# Patient Record
Sex: Female | Born: 1970 | Race: White | Hispanic: No | Smoking: Former smoker
Health system: Southern US, Community
[De-identification: ages and names within clinical notes are randomized; demographics above are authoritative.]

## PROBLEM LIST (undated history)

## (undated) DIAGNOSIS — T8859XA Other complications of anesthesia, initial encounter: Secondary | ICD-10-CM

## (undated) DIAGNOSIS — J45909 Unspecified asthma, uncomplicated: Secondary | ICD-10-CM

## (undated) DIAGNOSIS — T4145XA Adverse effect of unspecified anesthetic, initial encounter: Secondary | ICD-10-CM

## (undated) DIAGNOSIS — Z9889 Other specified postprocedural states: Secondary | ICD-10-CM

## (undated) DIAGNOSIS — R112 Nausea with vomiting, unspecified: Secondary | ICD-10-CM

## (undated) DIAGNOSIS — E041 Nontoxic single thyroid nodule: Secondary | ICD-10-CM

## (undated) DIAGNOSIS — N83209 Unspecified ovarian cyst, unspecified side: Secondary | ICD-10-CM

## (undated) DIAGNOSIS — N939 Abnormal uterine and vaginal bleeding, unspecified: Secondary | ICD-10-CM

## (undated) HISTORY — PX: BREAST ENHANCEMENT SURGERY: SHX7

## (undated) HISTORY — DX: Abnormal uterine and vaginal bleeding, unspecified: N93.9

## (undated) HISTORY — PX: DILATION AND CURETTAGE OF UTERUS: SHX78

## (undated) HISTORY — PX: HYSTEROSCOPY: SHX211

## (undated) HISTORY — DX: Nontoxic single thyroid nodule: E04.1

## (undated) HISTORY — PX: ABLATION: SHX5711

## (undated) HISTORY — PX: SALPINGECTOMY: SHX328

## (undated) HISTORY — DX: Unspecified ovarian cyst, unspecified side: N83.209

---
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
## 1992-06-11 HISTORY — PX: TUBAL LIGATION: SHX77

## 2006-06-11 HISTORY — PX: BUNIONECTOMY: SHX129

## 2008-10-28 ENCOUNTER — Ambulatory Visit: Payer: Self-pay | Admitting: Family Medicine

## 2008-10-28 DIAGNOSIS — M779 Enthesopathy, unspecified: Secondary | ICD-10-CM | POA: Insufficient documentation

## 2009-02-22 DIAGNOSIS — N643 Galactorrhea not associated with childbirth: Secondary | ICD-10-CM | POA: Insufficient documentation

## 2009-03-07 LAB — HM PAP SMEAR: HM Pap smear: NEGATIVE

## 2009-05-11 ENCOUNTER — Ambulatory Visit: Payer: Self-pay | Admitting: Family Medicine

## 2009-05-20 ENCOUNTER — Ambulatory Visit: Payer: Self-pay | Admitting: Urology

## 2010-09-12 ENCOUNTER — Ambulatory Visit: Payer: Self-pay

## 2010-10-15 ENCOUNTER — Ambulatory Visit: Payer: Self-pay | Admitting: Family Medicine

## 2013-09-09 ENCOUNTER — Encounter: Payer: Self-pay | Admitting: Family Medicine

## 2013-10-09 ENCOUNTER — Encounter: Payer: Self-pay | Admitting: Family Medicine

## 2013-11-09 ENCOUNTER — Encounter: Payer: Self-pay | Admitting: Family Medicine

## 2014-06-15 ENCOUNTER — Ambulatory Visit: Payer: Self-pay | Admitting: Obstetrics & Gynecology

## 2014-06-17 ENCOUNTER — Ambulatory Visit: Payer: Self-pay | Admitting: Obstetrics & Gynecology

## 2014-06-17 LAB — CBC
HCT: 37.3 % (ref 35.0–47.0)
HGB: 11.9 g/dL — AB (ref 12.0–16.0)
MCH: 29.4 pg (ref 26.0–34.0)
MCHC: 31.9 g/dL — AB (ref 32.0–36.0)
MCV: 92 fL (ref 80–100)
Platelet: 464 10*3/uL — ABNORMAL HIGH (ref 150–440)
RBC: 4.04 10*6/uL (ref 3.80–5.20)
RDW: 12.5 % (ref 11.5–14.5)
WBC: 13.4 10*3/uL — ABNORMAL HIGH (ref 3.6–11.0)

## 2014-06-17 LAB — PREGNANCY, URINE: PREGNANCY TEST, URINE: NEGATIVE m[IU]/mL

## 2014-06-18 ENCOUNTER — Ambulatory Visit: Payer: Self-pay | Admitting: Obstetrics & Gynecology

## 2014-10-04 LAB — SURGICAL PATHOLOGY

## 2014-10-10 NOTE — Op Note (Signed)
PATIENT NAME:  Kristen Meyer, Kristen Meyer MR#:  [id number] DATE OF BIRTH:  [birth year]  DATE OF PROCEDURE:  06/18/2014  PREOPERATIVE DIAGNOSIS: Pelvic pain and menorrhagia.  POSTOPERATIVE DIAGNOSIS: Pelvic pain and menorrhagia.  PROCEDURES PERFORMED: Hysteroscopy D and C procedure with endometrial resection and operative laparoscopy with partial right salpingectomy and aspiration of left ovarian cyst.   SURGEON: R. Annamarie MajorPaul Harris, MD  ANESTHESIA: General.   ESTIMATED BLOOD LOSS: Minimal.   COMPLICATIONS: None.   FINDINGS: Hysteroscopy reveals an abundant amount of reactive versus adhesive tissue throughout the endometrial cavity. Laparoscopy reveals Meyer right hydrosalpinx, evidence for bilateral tubal ligation using Falope-Rings, and Meyer small normal-appearing left ovarian cyst. The patient has Meyer visually normal right ovary, uterus, appendix, gallbladder, liver, bowel, and other structures. There is no evidence for abscess, infection, bleeding, or other problems within the anterior abdominal cavity.   DISPOSITION: To the recovery room in stable condition.   TECHNIQUE: The patient is prepped and draped in the usual sterile fashion after adequate anesthesia is obtained in the dorsal lithotomy position. Meyer Foley catheter is inserted. Meyer speculum is placed and the cervix is grasped with Meyer tenaculum and the cervix is dilated to size Meyer 18 Pratt dilator. Meyer hysteroscope is inserted with saline distention of the intrauterine cavity and the above-mentioned findings are visualized. Using the minus resection device, an endometrial resection is performed with Meyer significant amount of tissue removed to obtain Meyer clear surface and uniform lining to the intrauterine cavity. There was minimal bleeding noted from the uterus. The tenaculum was removed after the camera was removed with about 500 mL discrepancy of saline fluid during the portion of the case.   Attention is then to the abdomen, where Meyer Veress needle is inserted  through Meyer 5 mm infraumbilical incision after Marcaine is used to anesthetize the skin. Veress needle placement is confirmed using the hanging drop technique and the abdomen is then insufflated with CO2 gas. Meyer 5 mm trocar is then inserted under direct visualization with the laparoscope with no injuries or bleeding noted. The patient is placed in Trendelenburg positioning and the above-mentioned findings are visualized. Meyer 5 mm trocar is placed in the right lower quadrant, lateral to the inferior epigastric blood vessels and an 11 mm trocar is placed in the suprapubic region with no injuries or bleeding noted. Using the 5 mm Harmonic scalpel, the right fallopian tube is carefully dissected free along the mesosalpinx with preservation of the blood supply in the right ovary. The removed partial salpingectomy is then placed in an Endopouch and removed and sent to pathology for further review. The left ovarian cyst is stabilized and is aspirated using the Harmonic scalpel, with simple clear fluid from the small cyst. No other procedures are necessary. There is no free fluid or bleeding in the pelvis. Gas is expelled, trocars are removed, and skin is closed with Dermabond skin glue. The patient goes to the recovery room in stable condition. All sponge, instrument, and needle counts are correct at the conclusion of the case.   ____________________________ R. Annamarie MajorPaul Harris, MD rph:mw D: 06/18/2014 12:50:56 ET T: 06/18/2014 13:33:42 ET JOB#: [id number]  cc: Dierdre Searles. Paul Harris, MD, <Dictator> Nadara MustardOBERT P HARRIS MD ELECTRONICALLY SIGNED 06/22/2014 14:06

## 2015-02-24 DIAGNOSIS — M545 Low back pain, unspecified: Secondary | ICD-10-CM | POA: Insufficient documentation

## 2015-02-24 DIAGNOSIS — K30 Functional dyspepsia: Secondary | ICD-10-CM | POA: Insufficient documentation

## 2015-02-24 DIAGNOSIS — K5900 Constipation, unspecified: Secondary | ICD-10-CM | POA: Insufficient documentation

## 2015-04-21 ENCOUNTER — Encounter: Payer: Self-pay | Admitting: Family Medicine

## 2015-04-21 ENCOUNTER — Ambulatory Visit (INDEPENDENT_AMBULATORY_CARE_PROVIDER_SITE_OTHER): Payer: 59 | Admitting: Family Medicine

## 2015-04-21 VITALS — BP 96/64 | HR 88 | Temp 97.7°F | Resp 16 | Wt 152.0 lb

## 2015-04-21 DIAGNOSIS — A09 Infectious gastroenteritis and colitis, unspecified: Secondary | ICD-10-CM | POA: Diagnosis not present

## 2015-04-21 DIAGNOSIS — R1030 Lower abdominal pain, unspecified: Secondary | ICD-10-CM | POA: Diagnosis not present

## 2015-04-21 DIAGNOSIS — R197 Diarrhea, unspecified: Secondary | ICD-10-CM

## 2015-04-21 LAB — POCT URINALYSIS DIPSTICK
Bilirubin, UA: NEGATIVE
Clarity, UA: NEGATIVE
Glucose, UA: NEGATIVE
KETONES UA: NEGATIVE
Leukocytes, UA: NEGATIVE
Nitrite, UA: NEGATIVE
PH UA: 6.5
Protein, UA: NEGATIVE
Spec Grav, UA: 1.02
Urobilinogen, UA: 0.2

## 2015-04-21 NOTE — Progress Notes (Signed)
Patient ID: Kristen Meyer, female   DOB: 1971/03/01, 44 y.o.   MRN: [id number] Name: Kristen Meyer   MRN: [id number]    DOB: 07/13/1970   Date:04/21/2015       Progress Note  Subjective  Chief Complaint  Chief Complaint  Patient presents with  . Abdominal Pain  . Diarrhea   Abdominal Pain This is a new problem. The current episode started yesterday (had similar abdominal pain 02-14-15 and 03-25-15). The problem occurs 2 to 4 times per day. The problem has been unchanged. Pain location: across lower abdomen. Quality: bloating and abdominal cramps. The abdominal pain does not radiate. Associated symptoms include diarrhea and nausea. Pertinent negatives include no constipation, dysuria, fever, hematuria or vomiting. Associated symptoms comments: Some dark to black stools. She has tried antibiotics for the symptoms. Prior diagnostic workup includes upper endoscopy and GI consult (for gastritis and epigastric discomfort with positive H.pylori in 2012).  Diarrhea  This is a new problem. The current episode started more than 1 month ago (Seen in an Urgent Care clinic 03-25-15 and treated with Cipro with improvement.). The problem occurs 2 to 4 times per day. The problem has been unchanged. Associated symptoms include abdominal pain and bloating. Pertinent negatives include no fever or vomiting. Associated symptoms comments: Initially felt to be due to some seafood she ate [place] Day weekend at the beach but symptoms continue to return.. Risk factors include suspect food intake.   Patient Active Problem List   Diagnosis Date Noted  . CN (constipation) 02/24/2015  . Acid indigestion 02/24/2015  . LBP (low back pain) 02/24/2015  . Galactorrhea not associated with childbirth 02/22/2009  . Enthesopathy 10/28/2008  . Anemia, iron deficiency 03/11/2007   Past Surgical History  Procedure Laterality Date  . Tubal ligation  1994  . Tonsillectomy and adenoidectomy  1988  . Bunionectomy Right  2008   Family History  Problem Relation Age of Onset  . COPD Mother   . Healthy Father   . Healthy Sister   . Healthy Brother    History reviewed. No pertinent past medical history.  Social History  Substance Use Topics  . Smoking status: Former Games developer  . Smokeless tobacco: Not on file     Comment: QUIT IN 1992  . Alcohol Use: No   No current outpatient prescriptions on file.  Allergies  Allergen Reactions  . Amoxicillin Hives  . Clarithromycin   . Latex   . Penicillins   . Sulfa Antibiotics    Review of Systems  Constitutional: Negative.  Negative for fever.  HENT: Negative.   Eyes: Negative.   Respiratory: Negative.   Cardiovascular: Negative.   Gastrointestinal: Positive for nausea, abdominal pain, diarrhea and bloating. Negative for vomiting and constipation.  Genitourinary: Negative.  Negative for dysuria and hematuria.  Musculoskeletal: Negative.   Neurological: Negative.   Endo/Heme/Allergies: Negative.   Psychiatric/Behavioral: Negative.    Objective  Filed Vitals:   04/21/15 1024  BP: 96/64  Pulse: 88  Temp: 97.7 F (36.5 C)  TempSrc: Oral  Resp: 16  Weight: 152 lb (68.947 kg)  SpO2: 98%   Physical Exam  Constitutional: She is oriented to person, place, and time and well-developed, well-nourished, and in no distress.  HENT:  Head: Normocephalic.  Eyes: Conjunctivae are normal.  Neck: Neck supple.  Cardiovascular: Normal rate and regular rhythm.   Pulmonary/Chest: Breath sounds normal.  Abdominal: Soft. Bowel sounds are normal. There is tenderness.  Across lower abdomen with deep palpation. No masses.  Musculoskeletal: Normal range of motion.  Neurological: She is alert and oriented to person, place, and time.    Assessment & Plan  1. Lower abdominal pain Initial incidence was Labor Day weekend after eating some seafood at the beach. Symptoms abated in a few days but returned the next month. Was evaluated at an Urgent Care clinic and  treated with Cipro. Improved but has had recurrence yesterday with some dark to black stools (has not taken any Pepto-Bismol) and lower abdominal cramps (worse on the left). Suspect infectious diarrhea or diverticulitis. Will get routine labs and schedule CT scan. History of bilateral tubal ligation in 1994 and endometrial ablation therapy for DUB December 2015. Had a complication of uterine perforation requiring further surgery January 2016. - CBC with Differential/Platelet - COMPLETE METABOLIC PANEL WITH GFR - CT Abdomen Pelvis W Contrast  2. Diarrhea of presumed infectious origin Denies fever but has some dark to black stools. No epigastric discomfort or dyspepsia. Worried about persistent infection [place] Will get stool culture and should start a bland diet with increased fluid intake. - Stool Culture

## 2015-04-22 ENCOUNTER — Telehealth: Payer: Self-pay | Admitting: Family Medicine

## 2015-04-22 ENCOUNTER — Telehealth: Payer: Self-pay

## 2015-04-22 LAB — COMPREHENSIVE METABOLIC PANEL
A/G RATIO: 1.7 (ref 1.1–2.5)
ALT: 13 IU/L (ref 0–32)
AST: 19 IU/L (ref 0–40)
Albumin: 4.3 g/dL (ref 3.5–5.5)
Alkaline Phosphatase: 69 IU/L (ref 39–117)
BUN/Creatinine Ratio: 12 (ref 9–23)
BUN: 11 mg/dL (ref 6–24)
Bilirubin Total: 0.5 mg/dL (ref 0.0–1.2)
CO2: 27 mmol/L (ref 18–29)
Calcium: 9.3 mg/dL (ref 8.7–10.2)
Chloride: 99 mmol/L (ref 97–106)
Creatinine, Ser: 0.92 mg/dL (ref 0.57–1.00)
GFR, EST AFRICAN AMERICAN: 88 mL/min/{1.73_m2} (ref >59)
GFR, EST NON AFRICAN AMERICAN: 77 mL/min/{1.73_m2} (ref >59)
GLOBULIN, TOTAL: 2.6 g/dL (ref 1.5–4.5)
Glucose: 80 mg/dL (ref 65–99)
POTASSIUM: 4.2 mmol/L (ref 3.5–5.2)
SODIUM: 141 mmol/L (ref 136–144)
TOTAL PROTEIN: 6.9 g/dL (ref 6.0–8.5)

## 2015-04-22 LAB — CBC WITH DIFFERENTIAL/PLATELET
BASOS: 1 %
Basophils Absolute: 0 10*3/uL (ref 0.0–0.2)
EOS (ABSOLUTE): 0.3 10*3/uL (ref 0.0–0.4)
Eos: 5 %
Hematocrit: 39 % (ref 34.0–46.6)
Hemoglobin: 13 g/dL (ref 11.1–15.9)
IMMATURE GRANS (ABS): 0 10*3/uL (ref 0.0–0.1)
Immature Granulocytes: 0 %
Lymphocytes Absolute: 1.3 10*3/uL (ref 0.7–3.1)
Lymphs: 23 %
MCH: 30.3 pg (ref 26.6–33.0)
MCHC: 33.3 g/dL (ref 31.5–35.7)
MCV: 91 fL (ref 79–97)
MONOS ABS: 0.5 10*3/uL (ref 0.1–0.9)
Monocytes: 8 %
NEUTROS ABS: 3.5 10*3/uL (ref 1.4–7.0)
Neutrophils: 63 %
Platelets: 361 10*3/uL (ref 150–379)
RBC: 4.29 x10E6/uL (ref 3.77–5.28)
RDW: 13.1 % (ref 12.3–15.4)
WBC: 5.7 10*3/uL (ref 3.4–10.8)

## 2015-04-22 NOTE — Telephone Encounter (Signed)
Insurance company would like a peer to peer to get CT approved.Phone [phone].Case # [id number]

## 2015-04-22 NOTE — Telephone Encounter (Signed)
Left message to call back or come in to the office if any problems tomorrow. Still waiting on the stool culture report.

## 2015-04-22 NOTE — Telephone Encounter (Signed)
Patient was advised of labs and she wanted to let you know of a new new symptoms and yesterday she developed severe fatigue, weakness, severe tender breasts and nipples, headache, no nausea or vomiting, a little bit of a sore throat. She still feels like this today and states these are the symptoms like she had before (i am assuming it is something you know about as she said). -aa

## 2015-04-26 NOTE — Telephone Encounter (Signed)
Got confirmation for CT scan of abdomen and pelvis with contrast from peer-to-peer discussion (confirmation # [id number] valid till 06-10-15).

## 2015-04-29 LAB — STOOL CULTURE: E coli, Shiga toxin Assay: NEGATIVE

## 2015-05-02 ENCOUNTER — Telehealth: Payer: Self-pay | Admitting: Family Medicine

## 2015-05-02 NOTE — Telephone Encounter (Signed)
Patient advised as directed below. Patient verbalized understanding.  

## 2015-05-02 NOTE — Telephone Encounter (Signed)
Please review

## 2015-05-02 NOTE — Telephone Encounter (Signed)
Advise patient the CT scan was ordered to cover the lower abdomen and pelvis. This will give pictures from front to back and look at all structures in that area.

## 2015-05-02 NOTE — Telephone Encounter (Signed)
Pt called saying she is still hurting in her lower abdomin and back .  She went to a massage therapist.  Pt wants to make sure that the CT that she is having Wednesday will cover the area that she is having pain in.  Her call back is [phone]  Thanks Barth Kirkseri

## 2015-05-02 NOTE — Telephone Encounter (Signed)
-----   Message from Jodell CiproDennis E Waucomahrismon, [place] sent at 04/29/2015  5:58 PM EST ----- Stool culture final report was negative for any infection (even E.coli assay was negative). Finally got insurance to approve CT scan and it was set for 05-04-15. Awaiting final report.

## 2015-05-02 NOTE — Telephone Encounter (Signed)
LMTCB

## 2015-05-04 ENCOUNTER — Ambulatory Visit
Admission: RE | Admit: 2015-05-04 | Discharge: 2015-05-04 | Disposition: A | Discharge status: Home / Self Care | Payer: 59 | Source: Ambulatory Visit | Attending: Family Medicine | Admitting: Family Medicine

## 2015-05-04 DIAGNOSIS — R1030 Lower abdominal pain, unspecified: Secondary | ICD-10-CM | POA: Insufficient documentation

## 2015-05-04 MED ORDER — IOHEXOL 350 MG/ML SOLN
100.0000 mL | Freq: Once | INTRAVENOUS | Status: AC | PRN
  Administered 2015-05-04: 85 mL via INTRAVENOUS

## 2015-05-09 ENCOUNTER — Telehealth: Payer: Self-pay

## 2015-05-09 NOTE — Telephone Encounter (Signed)
Advised patient as below. Patient reports that she will call if pain persists.

## 2015-05-09 NOTE — Telephone Encounter (Signed)
Left message to call back  

## 2015-05-09 NOTE — Telephone Encounter (Signed)
-----   Message from Dennis Tamsen RoersE Chrismon, [place] sent at 05/09/2015  5:53 AM EST ----- No evidence of acute intra-abdominal or pelvic abnormalities on CT scan. Normal appendix and no kidney stones. No sign of bowel obstruction of diverticulitis. Some moderate amount of stool in the colon (minimal constipation). If abdomen no better with use of Pepto-Bismol, should consider referral to gastroenterologist. May need more specific treatment for possible irritable bowel syndrome.

## 2015-05-16 ENCOUNTER — Telehealth: Payer: Self-pay | Admitting: Family Medicine

## 2015-05-16 DIAGNOSIS — R1030 Lower abdominal pain, unspecified: Secondary | ICD-10-CM

## 2015-05-16 NOTE — Telephone Encounter (Signed)
Please advise 

## 2015-05-16 NOTE — Telephone Encounter (Signed)
Pt called saying she was still having problems after the CT and wants to see someone for a colonoscopy..  She said you had told her to call back.  Her call back is [phone]  Thanks  teri

## 2015-05-16 NOTE — Telephone Encounter (Signed)
Schedule referral to gastroenterologist for possible colonoscopy and evaluation of abdominal pain.

## 2015-05-17 NOTE — Telephone Encounter (Signed)
Patient advised as directed below. 

## 2015-06-22 LAB — HM COLONOSCOPY

## 2015-07-06 ENCOUNTER — Encounter: Payer: Self-pay | Admitting: Family Medicine

## 2015-07-27 ENCOUNTER — Encounter: Payer: Self-pay | Admitting: Family Medicine

## 2015-11-23 ENCOUNTER — Encounter: Payer: Self-pay | Admitting: Family Medicine

## 2015-11-23 ENCOUNTER — Ambulatory Visit (INDEPENDENT_AMBULATORY_CARE_PROVIDER_SITE_OTHER): Payer: 59 | Admitting: Family Medicine

## 2015-11-23 VITALS — BP 102/60 | HR 70 | Temp 97.9°F | Resp 16 | Ht 67.0 in | Wt 156.0 lb

## 2015-11-23 DIAGNOSIS — Z87448 Personal history of other diseases of urinary system: Secondary | ICD-10-CM | POA: Diagnosis not present

## 2015-11-23 DIAGNOSIS — J301 Allergic rhinitis due to pollen: Secondary | ICD-10-CM | POA: Diagnosis not present

## 2015-11-23 DIAGNOSIS — J45909 Unspecified asthma, uncomplicated: Secondary | ICD-10-CM | POA: Insufficient documentation

## 2015-11-23 DIAGNOSIS — R42 Dizziness and giddiness: Secondary | ICD-10-CM | POA: Diagnosis not present

## 2015-11-23 DIAGNOSIS — J309 Allergic rhinitis, unspecified: Secondary | ICD-10-CM | POA: Insufficient documentation

## 2015-11-23 LAB — POCT URINALYSIS DIPSTICK
Bilirubin, UA: NEGATIVE
Blood, UA: NEGATIVE
GLUCOSE UA: NEGATIVE
Ketones, UA: NEGATIVE
LEUKOCYTES UA: NEGATIVE
NITRITE UA: NEGATIVE
PROTEIN UA: NEGATIVE
Spec Grav, UA: 1.01
UROBILINOGEN UA: 0.2
pH, UA: 6

## 2015-11-23 MED ORDER — MECLIZINE HCL 25 MG PO TABS: 25.0000 mg | ORAL_TABLET | Freq: Three times a day (TID) | ORAL | Status: DC | PRN

## 2015-11-23 MED ORDER — MOMETASONE FUROATE 50 MCG/ACT NA SUSP: 2.0000 | Freq: Every day | NASAL | Status: DC

## 2015-11-23 MED ORDER — ALBUTEROL SULFATE HFA 108 (90 BASE) MCG/ACT IN AERS
2.0000 | INHALATION_SPRAY | Freq: Four times a day (QID) | RESPIRATORY_TRACT | Status: DC | PRN
Start: 2015-11-23 — End: 2016-07-11

## 2015-11-23 NOTE — Progress Notes (Signed)
Subjective:     Patient ID: Kristen Meyer, female   DOB: [birth year], 45 y.o.   MRN: [id number]  HPI  Chief Complaint  Patient presents with  . Allergies  . Dizziness  States allergies are flaring after visits to the beach. This is manifested by clear PND, wheezing, and vertigo. Reports she needs refills of her albuterol inhaler. Also has noticed decreased urinary output despite intake of fluids and wish U/A checked. Reports hx of prior kidney injury.   Review of Systems     Objective:   Physical Exam  Constitutional: She appears well-developed and well-nourished. No distress.  Ears: T.M's intact without inflammation Sinuses: non-tender Throat: tonsils absent Neck: no cervical adenopathy Lungs: clear     Assessment:    1. Allergic rhinitis due to pollen - mometasone (NASONEX) 50 MCG/ACT nasal spray; Place 2 sprays into the nose daily.  Dispense: 17 g; Refill: 5  2. History of changes in urinary output - POCT urinalysis dipstick  3. Mild reactive airways disease - albuterol (PROVENTIL HFA;VENTOLIN HFA) 108 (90 Base) MCG/ACT inhaler; Inhale 2 puffs into the lungs every 6 (six) hours as needed for wheezing or shortness of breath. May choose inhaler covered by insurance  Dispense: 1 Inhaler; Refill: 5  4. Vertigo - meclizine (ANTIVERT) 25 MG tablet; Take 1 tablet (25 mg total) by mouth 3 (three) times daily as needed for dizziness.  Dispense: 21 tablet; Refill: 0    Plan:    Call if vertigo persists.

## 2015-11-23 NOTE — Patient Instructions (Addendum)
It will take a few days for full effect of the steroid nasal spray. Let us know if vertigo does not improve over the next few days for ENT referral.

## 2016-01-19 ENCOUNTER — Encounter: Payer: Self-pay | Admitting: Family Medicine

## 2016-01-19 ENCOUNTER — Telehealth: Payer: Self-pay | Admitting: Family Medicine

## 2016-01-19 ENCOUNTER — Ambulatory Visit (INDEPENDENT_AMBULATORY_CARE_PROVIDER_SITE_OTHER): Payer: 59 | Admitting: Family Medicine

## 2016-01-19 ENCOUNTER — Other Ambulatory Visit: Payer: Self-pay | Admitting: Family Medicine

## 2016-01-19 VITALS — BP 90/62 | HR 77 | Temp 97.5°F | Resp 16 | Wt 158.4 lb

## 2016-01-19 DIAGNOSIS — R519 Headache, unspecified: Secondary | ICD-10-CM

## 2016-01-19 DIAGNOSIS — W57XXXS Bitten or stung by nonvenomous insect and other nonvenomous arthropods, sequela: Secondary | ICD-10-CM | POA: Diagnosis not present

## 2016-01-19 DIAGNOSIS — R300 Dysuria: Secondary | ICD-10-CM

## 2016-01-19 DIAGNOSIS — R112 Nausea with vomiting, unspecified: Secondary | ICD-10-CM

## 2016-01-19 DIAGNOSIS — R109 Unspecified abdominal pain: Secondary | ICD-10-CM

## 2016-01-19 DIAGNOSIS — N760 Acute vaginitis: Secondary | ICD-10-CM

## 2016-01-19 DIAGNOSIS — R51 Headache: Secondary | ICD-10-CM | POA: Diagnosis not present

## 2016-01-19 DIAGNOSIS — R509 Fever, unspecified: Secondary | ICD-10-CM | POA: Diagnosis not present

## 2016-01-19 DIAGNOSIS — S30861S Insect bite (nonvenomous) of abdominal wall, sequela: Secondary | ICD-10-CM

## 2016-01-19 LAB — POCT URINALYSIS DIPSTICK
Bilirubin, UA: NEGATIVE
Blood, UA: NEGATIVE
Glucose, UA: NEGATIVE
KETONES UA: NEGATIVE
LEUKOCYTES UA: NEGATIVE
NITRITE UA: NEGATIVE
PH UA: 7
PROTEIN UA: NEGATIVE
Spec Grav, UA: 1.01
Urobilinogen, UA: 1

## 2016-01-19 MED ORDER — FLUCONAZOLE 150 MG PO TABS: 150.0000 mg | ORAL_TABLET | Freq: Once | ORAL | 1 refills | Status: AC

## 2016-01-19 MED ORDER — ONDANSETRON HCL 8 MG PO TABS: 8.0000 mg | ORAL_TABLET | Freq: Three times a day (TID) | ORAL | 0 refills | Status: DC | PRN

## 2016-01-19 MED ORDER — DOXYCYCLINE HYCLATE 100 MG PO TABS: 100.0000 mg | ORAL_TABLET | Freq: Two times a day (BID) | ORAL | 0 refills | Status: DC

## 2016-01-19 NOTE — Telephone Encounter (Signed)
For review. KW 

## 2016-01-19 NOTE — Telephone Encounter (Signed)
Patient has been advised. KW 

## 2016-01-19 NOTE — Progress Notes (Signed)
Subjective:     Patient ID: Kristen Meyer, female   DOB: [birth year], 45 y.o.   MRN: [id number]  HPI  Chief Complaint  Patient presents with  . Headache    Patient comes in office today with complaints of headache and fatigue for the past 5 days. Patient states associated with headaches she has had nausea and vomiting. Patient reports following symptoms over the past five days: abdominal cramps, loose stools, bloating, mild dysuria and fever high of 101.3.    States she had an embedded tick several weeks ago on her left flank associated with both an annular rash and a general rash on her left side which has resolved.    Review of Systems     Objective:   Physical Exam  Constitutional: She appears well-developed and well-nourished. No distress.  Abdominal: Soft. There is tenderness (mild in lower quadrants). There is no guarding.  Ears: T.M's intact without inflammation Sinuses: non-tender Throat: tonsils absent Neck: no cervical adenopathy Lungs: clear     Assessment:    1. Fever, unspecified fever cause: cover for tick fever - doxycycline (VIBRA-TABS) 100 MG tablet; Take 1 tablet (100 mg total) by mouth 2 (two) times daily.  Dispense: 20 tablet; Refill: 0 - CBC with Differential/Platelet  2. Headache, unspecified headache type: - doxycycline (VIBRA-TABS) 100 MG tablet; Take 1 tablet (100 mg total) by mouth 2 (two) times daily.  Dispense: 20 tablet; Refill: 0  3. Dysuria - POCT urinalysis dipstick  4. Abdominal cramp - Comprehensive metabolic panel  5. Non-intractable vomiting with nausea, vomiting of unspecified type - ondansetron (ZOFRAN) 8 MG tablet; Take 1 tablet (8 mg total) by mouth every 8 (eight) hours as needed for nausea or vomiting.  Dispense: 21 tablet; Refill: 0  6. Tick bite of flank, sequela - Lyme Disease, IgM, Early Test w/ Rflx - Rickettsial Fever Abs    Plan:    Further f/u pending lab work.

## 2016-01-19 NOTE — Telephone Encounter (Signed)
Diflucan sent in

## 2016-01-19 NOTE — Patient Instructions (Addendum)
Let me know if not improving over the next few days.

## 2016-01-19 NOTE — Telephone Encounter (Signed)
Pt would like Diflucan to be called into [facility]en [facility] b/c she was advised to start taking Doxycycline and that will cause her to get a yeast infection and she would like to go ahead and have Diflucan on hand. Please advise. Thanks TNP

## 2016-01-20 ENCOUNTER — Telehealth: Payer: Self-pay

## 2016-01-20 NOTE — Telephone Encounter (Signed)
Patient has been advised. KW 

## 2016-01-20 NOTE — Telephone Encounter (Signed)
-----   Message from Anola Gurneyobert Chauvin, [place] sent at 01/20/2016  7:54 AM EDT ----- Labs normal pending tick fever antibodies

## 2016-01-23 ENCOUNTER — Telehealth: Payer: Self-pay

## 2016-01-23 LAB — LYME, WESTERN BLOT, SERUM (REFLEXED)
IGG P18 AB.: ABSENT
IGG P23 AB.: ABSENT
IGG P30 AB.: ABSENT
IGG P39 AB.: ABSENT
IGG P93 AB.: ABSENT
IGM P39 AB.: ABSENT
IGM P41 AB.: ABSENT
IgG P28 Ab.: ABSENT
IgG P45 Ab.: ABSENT
IgG P58 Ab.: ABSENT
IgG P66 Ab.: ABSENT
Lyme IgG Wb: NEGATIVE
Lyme IgM Wb: NEGATIVE

## 2016-01-23 LAB — CBC WITH DIFFERENTIAL/PLATELET
BASOS ABS: 0 10*3/uL (ref 0.0–0.2)
BASOS: 1 %
EOS (ABSOLUTE): 0.4 10*3/uL (ref 0.0–0.4)
Eos: 4 %
HEMOGLOBIN: 13.1 g/dL (ref 11.1–15.9)
Hematocrit: 39.2 % (ref 34.0–46.6)
Immature Grans (Abs): 0 10*3/uL (ref 0.0–0.1)
Immature Granulocytes: 0 %
LYMPHS ABS: 1.6 10*3/uL (ref 0.7–3.1)
Lymphs: 20 %
MCH: 30.6 pg (ref 26.6–33.0)
MCHC: 33.4 g/dL (ref 31.5–35.7)
MCV: 92 fL (ref 79–97)
Monocytes Absolute: 0.7 10*3/uL (ref 0.1–0.9)
Monocytes: 8 %
NEUTROS ABS: 5.4 10*3/uL (ref 1.4–7.0)
Neutrophils: 67 %
PLATELETS: 382 10*3/uL — AB (ref 150–379)
RBC: 4.28 x10E6/uL (ref 3.77–5.28)
RDW: 13 % (ref 12.3–15.4)
WBC: 8.1 10*3/uL (ref 3.4–10.8)

## 2016-01-23 LAB — COMPREHENSIVE METABOLIC PANEL
A/G RATIO: 1.5 (ref 1.2–2.2)
ALBUMIN: 4.1 g/dL (ref 3.5–5.5)
ALK PHOS: 66 IU/L (ref 39–117)
ALT: 14 IU/L (ref 0–32)
AST: 17 IU/L (ref 0–40)
BILIRUBIN TOTAL: 0.4 mg/dL (ref 0.0–1.2)
BUN / CREAT RATIO: 9 (ref 9–23)
BUN: 8 mg/dL (ref 6–24)
CHLORIDE: 100 mmol/L (ref 96–106)
CO2: 24 mmol/L (ref 18–29)
Calcium: 9.1 mg/dL (ref 8.7–10.2)
Creatinine, Ser: 0.93 mg/dL (ref 0.57–1.00)
GFR calc Af Amer: 86 mL/min/{1.73_m2} (ref >59)
GFR calc non Af Amer: 75 mL/min/{1.73_m2} (ref >59)
GLUCOSE: 69 mg/dL (ref 65–99)
Globulin, Total: 2.7 g/dL (ref 1.5–4.5)
POTASSIUM: 4.2 mmol/L (ref 3.5–5.2)
SODIUM: 140 mmol/L (ref 134–144)
Total Protein: 6.8 g/dL (ref 6.0–8.5)

## 2016-01-23 LAB — RICKETTSIAL FEVER ABS
Q FEVER PHASE I: NEGATIVE
Q FEVER PHASE II: NEGATIVE
RMSF IgG: NEGATIVE

## 2016-01-23 LAB — LYME, IGM, EARLY TEST/REFLEX: LYME DISEASE AB, QUANT, IGM: 0.95 {index} — AB (ref 0.00–0.79)

## 2016-01-23 NOTE — Telephone Encounter (Signed)
Patient was advised she states she is feeling much better on the doxycycline. KW

## 2016-01-23 NOTE — Telephone Encounter (Signed)
-----   Message from Anola Gurneyobert Chauvin, [place] sent at 01/23/2016 12:05 PM EDT ----- Tick fever antibodies negative but would continue doxycycline if improving.

## 2016-01-23 NOTE — Telephone Encounter (Signed)
[id number] 

## 2016-07-11 ENCOUNTER — Encounter: Payer: Self-pay | Admitting: Obstetrics and Gynecology

## 2016-07-11 ENCOUNTER — Ambulatory Visit (INDEPENDENT_AMBULATORY_CARE_PROVIDER_SITE_OTHER): Payer: Managed Care, Other (non HMO) | Admitting: Obstetrics and Gynecology

## 2016-07-11 VITALS — BP 103/70 | HR 80 | Ht 67.0 in | Wt 159.0 lb

## 2016-07-11 DIAGNOSIS — R102 Pelvic and perineal pain: Secondary | ICD-10-CM | POA: Diagnosis not present

## 2016-07-11 DIAGNOSIS — Z9851 Tubal ligation status: Secondary | ICD-10-CM | POA: Diagnosis not present

## 2016-07-11 DIAGNOSIS — Z9889 Other specified postprocedural states: Secondary | ICD-10-CM

## 2016-07-11 DIAGNOSIS — R319 Hematuria, unspecified: Secondary | ICD-10-CM | POA: Diagnosis not present

## 2016-07-11 LAB — POCT URINALYSIS DIPSTICK
Bilirubin, UA: NEGATIVE
GLUCOSE UA: NEGATIVE
KETONES UA: NEGATIVE
LEUKOCYTES UA: NEGATIVE
Nitrite, UA: NEGATIVE
PH UA: 5
Protein, UA: NEGATIVE
Spec Grav, UA: 1.02
Urobilinogen, UA: NEGATIVE

## 2016-07-11 NOTE — Patient Instructions (Signed)
1. Laparoscopy with peritoneal biopsies will be scheduled 2. Continue taking ibuprofen 800 mg 3 times a day for cramps 3. Return in 1 week prior to surgery for preoperative appointment   Diagnostic Laparoscopy A diagnostic laparoscopy is a procedure to diagnose diseases in the abdomen. During the procedure, a thin, lighted, pencil-sized instrument called a laparoscope is inserted into the abdomen through an incision. The laparoscope allows your health care provider to look at the organs inside your body. Tell a health care provider about:  Any allergies you have.  All medicines you are taking, including vitamins, herbs, eye drops, creams, and over-the-counter medicines.  Any problems you or family members have had with anesthetic medicines.  Any blood disorders you have.  Any surgeries you have had.  Any medical conditions you have. What are the risks? Generally, this is a safe procedure. However, problems can occur, which may include:  Infection.  Bleeding.  Damage to other organs.  Allergic reaction to the anesthetics used during the procedure. What happens before the procedure?  Do not eat or drink anything after midnight on the night before the procedure or as directed by your health care provider.  Ask your health care provider about:  Changing or stopping your regular medicines.  Taking medicines such as aspirin and ibuprofen. These medicines can thin your blood. Do not take these medicines before your procedure if your health care provider instructs you not to.  Plan to have someone take you home after the procedure. What happens during the procedure?  You may be given a medicine to help you relax (sedative).  You will be given a medicine to make you sleep (general anesthetic).  Your abdomen will be inflated with a gas. This will make your organs easier to see.  Small incisions will be made in your abdomen.  A laparoscope and other small instruments will be  inserted into the abdomen through the incisions.  A tissue sample may be removed from an organ in the abdomen for examination.  The instruments will be removed from the abdomen.  The gas will be released.  The incisions will be closed with stitches (sutures). What happens after the procedure? Your blood pressure, heart rate, breathing rate, and blood oxygen level will be monitored often until the medicines you were given have worn off. This information is not intended to replace advice given to you by your health care provider. Make sure you discuss any questions you have with your health care provider. Document Released: 09/03/2000 Document Revised: 10/06/2015 Document Reviewed: 01/08/2014 Elsevier Interactive Patient Education  2017 [employer]

## 2016-07-11 NOTE — Progress Notes (Signed)
GYN ENCOUNTER NOTE  Subjective:   NEW PATIENT EVALUATION        Kristen Meyer is a 46 y.o. 122P2002 female is here for gynecologic evaluation of the following issues:  1. Pelvic pain - cyclic cramping and lower back pain, without bleeding. Interferes with her daily activities. Opted for endometrial ablation in 2015 "just to be done with my periods" which resulted in vaginal bleeding and pelvic pain until hysteroscope was performed in January 2016. Menstrual periods before 2015 were relatively normal with some heavy bleeding. No history of endometriosis.   Previous history notable for laparoscopic bilateral tubal banding with Falope-Rings greater than 20 years ago. Endometrial ablation in 2015 was reportedly associated with complications require surgery including hysteroscopy with resection of endometrium as well as laparoscopy with partial salpingectomy for hydrosalpinx.  Symptoms now include primarily cyclic cramping. No significant bladder symptoms. She does have constipation symptoms as well as diarrhea symptoms, cyclic, noted pre-and post pelvic cramping  2. Status post endometrial ablation 3. Status post tubal ligation   Gynecologic History No LMP recorded. Patient has had an ablation. Contraception: This patient has had an endometrial ablation.  Last Pap: 2-3 years ago. Doesn't recall abnormal results. Last mammogram: 3 years ago  Obstetric History OB History  Gravida Para Term Preterm AB Living  2 2 2     2   SAB TAB Ectopic Multiple Live Births          2    # Outcome Date GA Lbr Len/2nd Weight Sex Delivery Anes PTL Lv  2 Term 1993   8 lb 12.8 oz (3.992 kg) M Vag-Spont   LIV  1 Term 1991   7 lb 2.1 oz (3.234 kg) F Vag-Spont   LIV      Past Medical History:  Diagnosis Date  . Abnormal uterine bleeding   . Ovarian cyst    left    Past Surgical History:  Procedure Laterality Date  . ABLATION     uterine  . BUNIONECTOMY Right 2008  . DILATION AND CURETTAGE OF  UTERUS    . HYSTEROSCOPY    . SALPINGECTOMY Right    partial  . TONSILLECTOMY AND ADENOIDECTOMY  1988  . TUBAL LIGATION  1994    No current outpatient prescriptions on file prior to visit.   No current facility-administered medications on file prior to visit.     Allergies  Allergen Reactions  . Amoxicillin Hives  . Clarithromycin   . Latex   . Penicillins   . Sulfa Antibiotics     Social History   Social History  . Marital status: Married    Spouse name: N/A  . Number of children: N/A  . Years of education: N/A   Occupational History  . Not on file.   Social History Main Topics  . Smoking status: Former Games developermoker  . Smokeless tobacco: Never Used     Comment: QUIT IN [id number]  . Alcohol use No  . Drug use: No  . Sexual activity: Yes    Birth control/ protection: Surgical   Other Topics Concern  . Not on file   Social History Narrative  . No narrative on file    Family History  Problem Relation Age of Onset  . COPD Mother   . Healthy Father   . Healthy Sister   . Healthy Brother   . Breast cancer Neg Hx   . Ovarian cancer Neg Hx   . Colon cancer Neg Hx   . Diabetes  Neg Hx   . Heart disease Neg Hx     The following portions of the patient's history were reviewed and updated as appropriate: allergies, current medications, past family history, past medical history, past social history, past surgical history and problem list.  Review of Systems Review of Systems - General ROS: negative for - chills, fatigue, fever, hot flashes, malaise or night sweats Gastrointestinal ROS: Some cyclic abdominal pain, some cyclic constipation and diarrhea  Musculoskeletal ROS: negative for - joint pain, muscle pain or muscular weakness Genito-Urinary ROS: pelvic pain. No dyspareunia.  Objective:   BP 103/70   Pulse 80   Ht 5\' 7"  (1.702 m)   Wt 159 lb (72.1 kg)   BMI 24.90 kg/m  CONSTITUTIONAL: Well-developed, well-nourished female in no acute distress.  HENT:   Normocephalic, atraumatic.  NECK: Normal range of motion, supple, no masses.  Normal thyroid.  SKIN: Skin is warm and dry. No rash noted. Not diaphoretic. No erythema. No pallor. NEUROLGIC: Alert and oriented to person, place, and time. PSYCHIATRIC: Normal mood and affect. Normal behavior. Normal judgment and thought content. CARDIOVASCULAR:Not Examined RESPIRATORY: Not Examined BREASTS: Not Examined ABDOMEN: Soft, non distended; Non tender.  No Organomegaly. PELVIC:  Bony pelvis is gynecoid  External Genitalia: Normal  BUS: Normal  Vagina: Normal  Cervix: Parous. Nabothian cyst at 9 o'clock.   Uterus: Normal size, shape,consistency, mobile. Some midline tenderness,2/4  Adnexa: Normal; nonpalpable nontender  RV: Normal external exam  Bladder: Nontender MUSCULOSKELETAL: Normal range of motion. No tenderness.  No cyanosis, clubbing, or edema.     Assessment:   1. Pelvic pain, Cyclic, maybe associated with adenomyosis - Urine culture - POCT urinalysis dipstick  2. Hematuria, unspecified type - Urine culture  3. Status post endometrial ablation, possible complications associated from the patient's history  4. Status post tubal ligation (laparoscopic BTB)     Plan:   1. Urine culture 2. POCT urinalysis 3. Laparoscopic biopsy for endometriosis scheduled  4. Patient was counseled regarding the options of management and the workup of this pelvic pain syndrome. Immediate evaluation with laparoscopy and peritoneal biopsies and possibly rule out endometriosis. Alternative management could include treatment with Depo-Lupron to see if this has a clinical impact on her pain. This option is an empiric medical treatment option which could take 4-6 months   Kristen Spahn, PA-S Herold Harms, MD   I have seen, interviewed, and examined the patient in conjunction with the [facility].A. student and affirm the diagnosis and management plan. Kristen Roes A. Taliyah Watrous, MD, FACOG    Note: This dictation was prepared with Dragon dictation along with smaller phrase technology. Any transcriptional errors that result from this process are unintentional.

## 2016-07-12 DIAGNOSIS — Z9889 Other specified postprocedural states: Secondary | ICD-10-CM | POA: Insufficient documentation

## 2016-07-12 DIAGNOSIS — R102 Pelvic and perineal pain: Secondary | ICD-10-CM | POA: Insufficient documentation

## 2016-07-12 DIAGNOSIS — R319 Hematuria, unspecified: Secondary | ICD-10-CM | POA: Insufficient documentation

## 2016-07-12 DIAGNOSIS — Z9851 Tubal ligation status: Secondary | ICD-10-CM | POA: Insufficient documentation

## 2016-07-13 LAB — URINE CULTURE: Organism ID, Bacteria: NO GROWTH

## 2016-07-26 ENCOUNTER — Telehealth: Payer: Self-pay

## 2016-07-26 NOTE — Telephone Encounter (Signed)
Pt states that the rt side of her abd  Above her belly button towards her back is hurting. Taking ibup 800. Used a heating pad. Neither are helping. NO uti sx. Neg urine 1/31. Pos bm. Slight nausea. Pos po hydration. S/p ablation. Advised pt that it did not sound like gyn issue. Advised her to contact her pcp. Pt voices understanding.

## 2016-08-01 ENCOUNTER — Encounter: Payer: Self-pay | Admitting: Obstetrics and Gynecology

## 2016-08-01 ENCOUNTER — Ambulatory Visit (INDEPENDENT_AMBULATORY_CARE_PROVIDER_SITE_OTHER): Payer: Managed Care, Other (non HMO) | Admitting: Obstetrics and Gynecology

## 2016-08-01 VITALS — BP 105/71 | HR 73 | Ht 67.0 in | Wt 161.0 lb

## 2016-08-01 DIAGNOSIS — Z9889 Other specified postprocedural states: Secondary | ICD-10-CM

## 2016-08-01 DIAGNOSIS — Z9851 Tubal ligation status: Secondary | ICD-10-CM

## 2016-08-01 DIAGNOSIS — R102 Pelvic and perineal pain: Secondary | ICD-10-CM

## 2016-08-01 DIAGNOSIS — Z01818 Encounter for other preprocedural examination: Secondary | ICD-10-CM

## 2016-08-01 NOTE — Patient Instructions (Signed)
1. Return in 1 week after surgery for postop check 

## 2016-08-01 NOTE — Progress Notes (Signed)
Subjective:  PREOPERATIVE HISTORY AND PHYSICAL  Date of surgery: 08/06/2016 Procedure: Laparoscopy with peritoneal biopsies Diagnoses: 1. Chronic pelvic pain 2. Status post endometrial ablation 3. 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 G2P2024female scheduled for Laparoscopy with peritoneal biopsies on 08/06/2016 to evaluate chronic pelvic pain and rule out endometriosis. Patient history includes the following details: 1. Pelvic pain - cyclic cramping and lower back pain, without bleeding. Interferes with her daily activities. Opted for endometrial ablation in 2015 "just to be done with my periods" which resulted in vaginal bleeding and pelvic pain until hysteroscope was performed in January 2016. Menstrual periods before 2015 were relatively normal with some heavy bleeding. No history of endometriosis.   Previous history notable for laparoscopic bilateral tubal banding with Falope-Rings greater than 20 years ago. Endometrial ablation in 2015 was reportedly associated with complications require surgery including hysteroscopy with resection of endometrium as well as laparoscopy with partial salpingectomy for hydrosalpinx.  Symptoms now include primarily cyclic cramping. No significant bladder symptoms. She does have constipation symptoms as well as diarrhea symptoms, cyclic, noted pre-and post pelvic cramping  2. Status post endometrial ablation 3. Status post tubal ligation   Gynecologic History No LMP recorded. Patient has had an ablation. Contraception: This patient has had an endometrial ablation.  Last Pap: 2-3 years ago. Doesn't recall abnormal results. Last mammogram: 3 years ago  OB History    Gravida Para Term Preterm AB Living   2 2 2     2    SAB TAB Ectopic Multiple Live Births           2     No LMP recorded. Patient has had an ablation.    Past Medical History:  Diagnosis Date  . Abnormal uterine bleeding   .  Ovarian cyst    left    Past Surgical History:  Procedure Laterality Date  . ABLATION     uterine  . BUNIONECTOMY Right 2008  . DILATION AND CURETTAGE OF UTERUS    . HYSTEROSCOPY    . SALPINGECTOMY Right    partial  . TONSILLECTOMY AND ADENOIDECTOMY  1988  . TUBAL LIGATION  1994    OB History  Gravida Para Term Preterm AB Living  2 2 2     2   SAB TAB Ectopic Multiple Live Births          2    # Outcome Date GA Lbr Len/2nd Weight Sex Delivery Anes PTL Lv  2 Term 1993   8 lb 12.8 oz (3.992 kg) M Vag-Spont   LIV  1 Term 1991   7 lb 2.1 oz (3.234 kg) F Vag-Spont   LIV      Social History   Social History  . Marital status: Married    Spouse name: N/A  . Number of children: N/A  . Years of education: N/A   Social History Main Topics  . Smoking status: Former Games developer  . Smokeless tobacco: Never Used     Comment: QUIT IN 37  . Alcohol use No  . Drug use: No  . Sexual activity: Yes    Birth control/ protection: Surgical   Other Topics Concern  . None   Social History Narrative  . None    Family History  Problem Relation Age of Onset  . COPD Mother   . Healthy Father   . Healthy Sister   . Healthy Brother   . Breast cancer Neg Hx   .  Ovarian cancer Neg Hx   . Colon cancer Neg Hx   . Diabetes Neg Hx   . Heart disease Neg Hx      (Not in a hospital admission)  Allergies  Allergen Reactions  . Amoxicillin Hives    Has patient had a PCN reaction causing immediate rash, facial/tongue/throat swelling, SOB or lightheadedness with hypotension: No Has patient had a PCN reaction causing severe rash involving mucus membranes or skin necrosis: Yes Has patient had a PCN reaction that required hospitalization No Has patient had a PCN reaction occurring within the last 10 years: No If all of the above answers are "NO", then may proceed with Cephalosporin use.   Marland Kitchen. Penicillins   . Clarithromycin Nausea And Vomiting and Rash  . Latex Itching and Rash    burns   . Sulfa Antibiotics Itching, Nausea And Vomiting and Rash    Review of Systems Constitutional: No recent fever/chills/sweats Respiratory: No recent cough/bronchitis Cardiovascular: No chest pain Gastrointestinal: No recent nausea/vomiting/diarrhea Genitourinary: No UTI symptoms; patient reports recent right lower quadrant achiness and stabbing pain extending towards the back Hematologic/lymphatic:No history of coagulopathy or recent blood thinner use    Objective:    BP 105/71   Pulse 73   Ht 5\' 7"  (1.702 m)   Wt 161 lb (73 kg)   BMI 25.22 kg/m   General:   Normal  Skin:   normal  HEENT:  Normal  Neck:  Supple without Adenopathy or Thyromegaly  Lungs:   Heart:              Breasts:   Abdomen:  Pelvis:  M/S   Extremeties:  Neuro:    clear to auscultation bilaterally   Normal without murmur   Not Examined   soft, non-tender; bowel sounds normal; no masses,  no organomegaly   Exam deferred to OR  No CVAT  Warm/Dry   Normal         01/31/2018PELVIC:             Bony pelvis is gynecoid             External Genitalia: Normal             BUS: Normal             Vagina: Normal             Cervix: Parous. Nabothian cyst at 9 o'clock.              Uterus: Normal size, shape,consistency, mobile. Some midline tenderness,2/4             Adnexa: Normal; nonpalpable nontender             RV: Normal external exam             Bladder: Nontender  Assessment:    1. Chronic pelvic pain, cyclic, status post endometrial ablation, status post tubal ligation 2. Cannot rule out endometriosis   Plan:  Laparoscopy with peritoneal biopsies   Preop counseling: The patient is to undergo laparoscopy with peritoneal biopsies to evaluate of the pain syndrome. She is understanding of the planned procedure and is aware of and is accepting of all surgical risks which include but are not limited to bleeding, infection, pelvic organ injury with need for repair, blood clot disorders,  anesthesia risks, etc. All questions have been answered. Informed consent is given. Patient is ready and willing to proceed with surgery as scheduled.  Herold HarmsMartin A Juliyah Mergen, MD  Note: This  dictation was prepared with Dragon dictation along with smaller phrase technology. Any transcriptional errors that result from this process are unintentional.

## 2016-08-01 NOTE — H&P (Signed)
Subjective:  PREOPERATIVE HISTORY AND PHYSICAL  Date of surgery: 08/06/2016 Procedure: Laparoscopy with peritoneal biopsies Diagnoses: 1. Chronic pelvic pain 2. Status post endometrial ablation 3. 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 G2P2024female scheduled for Laparoscopy with peritoneal biopsies on 08/06/2016 to evaluate chronic pelvic pain and rule out endometriosis. Patient history includes the following details: 1. Pelvic pain - cyclic cramping and lower back pain, without bleeding. Interferes with her daily activities. Opted for endometrial ablation in 2015 "just to be done with my periods" which resulted in vaginal bleeding and pelvic pain until hysteroscope was performed in January 2016. Menstrual periods before 2015 were relatively normal with some heavy bleeding. No history of endometriosis.   Previous history notable for laparoscopic bilateral tubal banding with Falope-Rings greater than 20 years ago. Endometrial ablation in 2015 was reportedly associated with complications require surgery including hysteroscopy with resection of endometrium as well as laparoscopy with partial salpingectomy for hydrosalpinx.  Symptoms now include primarily cyclic cramping. No significant bladder symptoms. She does have constipation symptoms as well as diarrhea symptoms, cyclic, noted pre-and post pelvic cramping  2. Status post endometrial ablation 3. Status post tubal ligation   Gynecologic History No LMP recorded. Patient has had an ablation. Contraception: This patient has had an endometrial ablation.  Last Pap: 2-3 years ago. Doesn't recall abnormal results. Last mammogram: 3 years ago  OB History    Gravida Para Term Preterm AB Living   2 2 2     2    SAB TAB Ectopic Multiple Live Births           2     No LMP recorded. Patient has had an ablation.    Past Medical History:  Diagnosis Date  . Abnormal uterine bleeding   .  Ovarian cyst    left    Past Surgical History:  Procedure Laterality Date  . ABLATION     uterine  . BUNIONECTOMY Right 2008  . DILATION AND CURETTAGE OF UTERUS    . HYSTEROSCOPY    . SALPINGECTOMY Right    partial  . TONSILLECTOMY AND ADENOIDECTOMY  1988  . TUBAL LIGATION  1994    OB History  Gravida Para Term Preterm AB Living  2 2 2     2   SAB TAB Ectopic Multiple Live Births          2    # Outcome Date GA Lbr Len/2nd Weight Sex Delivery Anes PTL Lv  2 Term 1993   8 lb 12.8 oz (3.992 kg) M Vag-Spont   LIV  1 Term 1991   7 lb 2.1 oz (3.234 kg) F Vag-Spont   LIV      Social History   Social History  . Marital status: Married    Spouse name: N/A  . Number of children: N/A  . Years of education: N/A   Social History Main Topics  . Smoking status: Former Games developer  . Smokeless tobacco: Never Used     Comment: QUIT IN 37  . Alcohol use No  . Drug use: No  . Sexual activity: Yes    Birth control/ protection: Surgical   Other Topics Concern  . None   Social History Narrative  . None    Family History  Problem Relation Age of Onset  . COPD Mother   . Healthy Father   . Healthy Sister   . Healthy Brother   . Breast cancer Neg Hx   .  Ovarian cancer Neg Hx   . Colon cancer Neg Hx   . Diabetes Neg Hx   . Heart disease Neg Hx      (Not in a hospital admission)  Allergies  Allergen Reactions  . Amoxicillin Hives    Has patient had a PCN reaction causing immediate rash, facial/tongue/throat swelling, SOB or lightheadedness with hypotension: No Has patient had a PCN reaction causing severe rash involving mucus membranes or skin necrosis: Yes Has patient had a PCN reaction that required hospitalization No Has patient had a PCN reaction occurring within the last 10 years: No If all of the above answers are "NO", then may proceed with Cephalosporin use.   Marland Kitchen. Penicillins   . Clarithromycin Nausea And Vomiting and Rash  . Latex Itching and Rash    burns   . Sulfa Antibiotics Itching, Nausea And Vomiting and Rash    Review of Systems Constitutional: No recent fever/chills/sweats Respiratory: No recent cough/bronchitis Cardiovascular: No chest pain Gastrointestinal: No recent nausea/vomiting/diarrhea Genitourinary: No UTI symptoms; patient reports recent right lower quadrant achiness and stabbing pain extending towards the back Hematologic/lymphatic:No history of coagulopathy or recent blood thinner use    Objective:    BP 105/71   Pulse 73   Ht 5\' 7"  (1.702 m)   Wt 161 lb (73 kg)   BMI 25.22 kg/m   General:   Normal  Skin:   normal  HEENT:  Normal  Neck:  Supple without Adenopathy or Thyromegaly  Lungs:   Heart:              Breasts:   Abdomen:  Pelvis:  M/S   Extremeties:  Neuro:    clear to auscultation bilaterally   Normal without murmur   Not Examined   soft, non-tender; bowel sounds normal; no masses,  no organomegaly   Exam deferred to OR  No CVAT  Warm/Dry   Normal         01/31/2018PELVIC:             Bony pelvis is gynecoid             External Genitalia: Normal             BUS: Normal             Vagina: Normal             Cervix: Parous. Nabothian cyst at 9 o'clock.              Uterus: Normal size, shape,consistency, mobile. Some midline tenderness,2/4             Adnexa: Normal; nonpalpable nontender             RV: Normal external exam             Bladder: Nontender  Assessment:    1. Chronic pelvic pain, cyclic, status post endometrial ablation, status post tubal ligation 2. Cannot rule out endometriosis   Plan:  Laparoscopy with peritoneal biopsies   Preop counseling: The patient is to undergo laparoscopy with peritoneal biopsies to evaluate of the pain syndrome. She is understanding of the planned procedure and is aware of and is accepting of all surgical risks which include but are not limited to bleeding, infection, pelvic organ injury with need for repair, blood clot disorders,  anesthesia risks, etc. All questions have been answered. Informed consent is given. Patient is ready and willing to proceed with surgery as scheduled.  Herold HarmsMartin A Leylany Nored, MD  Note: This  dictation was prepared with Dragon dictation along with smaller phrase technology. Any transcriptional errors that result from this process are unintentional.

## 2016-08-02 ENCOUNTER — Encounter: Payer: Managed Care, Other (non HMO) | Admitting: Obstetrics and Gynecology

## 2016-08-02 ENCOUNTER — Encounter
Admission: RE | Admit: 2016-08-02 | Discharge: 2016-08-02 | Disposition: A | Discharge status: Home / Self Care | Payer: Managed Care, Other (non HMO) | Source: Ambulatory Visit | Attending: Obstetrics and Gynecology | Admitting: Obstetrics and Gynecology

## 2016-08-02 DIAGNOSIS — N939 Abnormal uterine and vaginal bleeding, unspecified: Secondary | ICD-10-CM | POA: Insufficient documentation

## 2016-08-02 DIAGNOSIS — D509 Iron deficiency anemia, unspecified: Secondary | ICD-10-CM | POA: Diagnosis not present

## 2016-08-02 DIAGNOSIS — N643 Galactorrhea not associated with childbirth: Secondary | ICD-10-CM | POA: Diagnosis not present

## 2016-08-02 DIAGNOSIS — J45909 Unspecified asthma, uncomplicated: Secondary | ICD-10-CM | POA: Insufficient documentation

## 2016-08-02 DIAGNOSIS — R319 Hematuria, unspecified: Secondary | ICD-10-CM | POA: Diagnosis not present

## 2016-08-02 DIAGNOSIS — K5900 Constipation, unspecified: Secondary | ICD-10-CM | POA: Insufficient documentation

## 2016-08-02 DIAGNOSIS — R102 Pelvic and perineal pain: Secondary | ICD-10-CM | POA: Insufficient documentation

## 2016-08-02 DIAGNOSIS — Z01812 Encounter for preprocedural laboratory examination: Secondary | ICD-10-CM | POA: Insufficient documentation

## 2016-08-02 HISTORY — DX: Nausea with vomiting, unspecified: Z98.890

## 2016-08-02 HISTORY — DX: Other specified postprocedural states: R11.2

## 2016-08-02 HISTORY — DX: Other complications of anesthesia, initial encounter: T88.59XA

## 2016-08-02 HISTORY — DX: Adverse effect of unspecified anesthetic, initial encounter: T41.45XA

## 2016-08-02 LAB — RAPID HIV SCREEN (HIV 1/2 AB+AG)
HIV 1/2 ANTIBODIES: NONREACTIVE
HIV-1 P24 Antigen - HIV24: NONREACTIVE

## 2016-08-02 LAB — CBC WITH DIFFERENTIAL/PLATELET
BASOS PCT: 1 %
Basophils Absolute: 0.1 10*3/uL (ref 0–0.1)
Eosinophils Absolute: 0.3 10*3/uL (ref 0–0.7)
Eosinophils Relative: 4 %
HEMATOCRIT: 36.5 % (ref 35.0–47.0)
HEMOGLOBIN: 12.7 g/dL (ref 12.0–16.0)
LYMPHS ABS: 1.5 10*3/uL (ref 1.0–3.6)
LYMPHS PCT: 22 %
MCH: 32 pg (ref 26.0–34.0)
MCHC: 34.8 g/dL (ref 32.0–36.0)
MCV: 91.9 fL (ref 80.0–100.0)
MONO ABS: 0.6 10*3/uL (ref 0.2–0.9)
MONOS PCT: 9 %
NEUTROS ABS: 4.5 10*3/uL (ref 1.4–6.5)
NEUTROS PCT: 64 %
Platelets: 367 10*3/uL (ref 150–440)
RBC: 3.97 MIL/uL (ref 3.80–5.20)
RDW: 13 % (ref 11.5–14.5)
WBC: 7 10*3/uL (ref 3.6–11.0)

## 2016-08-02 LAB — TYPE AND SCREEN
ABO/RH(D): A POS
ANTIBODY SCREEN: NEGATIVE

## 2016-08-02 NOTE — Pre-Procedure Instructions (Signed)
PATIENT REQUESTS ZOFRAN FOR N/V. STATES PHENERGAN AND TRANSDERM SCOP PATCH MAKE WORSE

## 2016-08-02 NOTE — Patient Instructions (Signed)
  Your procedure is scheduled on: 08/06/16 Report to Day Surgery. [facility]OND FLOOR To find out your arrival time please call [phone] between 1PM - 3PM on 08/03/16  Remember: Instructions that are not followed completely may result in serious medical risk, up to and including death, or upon the discretion of your surgeon and anesthesiologist your surgery may need to be rescheduled.    _X___ 1. Do not eat food or drink liquids after midnight. No gum chewing or hard candies.     ____ 2. No Alcohol for 24 hours before or after surgery.   ____ 3. Do Not Smoke For 24 Hours Prior to Your Surgery.   ____ 4. Bring all medications with you on the day of surgery if instructed.    __X__ 5. Notify your doctor if there is any change in your medical condition     (cold, fever, infections).       Do not wear jewelry, make-up, hairpins, clips or nail polish.  Do not wear lotions, powders, or perfumes. You may wear deodorant.  Do not shave 48 hours prior to surgery. Men may shave face and neck.  Do not bring valuables to the hospital.    [facility] is not [facility] for any belongings or valuables.               Contacts, dentures or bridgework may not be worn into surgery.  Leave your suitcase in the car. After surgery it may be brought to your room.  For patients admitted to the hospital, discharge time is determined by your                treatment team.   Patients discharged the day of surgery will not be allowed to drive home.   Please read over the following fact sheets that you were given:   Surgical Site Infection Prevention   ____ Take these medicines the morning of surgery with A SIP OF WATER:    1. NONE  2.   3.   4.  5.  6.  ____ Fleet Enema (as directed)   __X__ Use CHG Soap as directed  ____ Use inhalers on the day of surgery  ____ Stop metformin 2 days prior to surgery    ____ Take 1/2 of usual insulin dose the night before surgery and none on the  morning of surgery.   ____ Stop Coumadin/Plavix/aspirin on  __X__ Stop Anti-inflammatories on     ALREADY STOPPED   ____ Stop supplements until after surgery.    ____ Bring C-Pap to the hospital.

## 2016-08-03 LAB — RPR: RPR: NONREACTIVE

## 2016-08-06 ENCOUNTER — Encounter
Admission: RE | Disposition: A | Discharge status: Home / Self Care | Payer: Self-pay | Source: Ambulatory Visit | Attending: Obstetrics and Gynecology

## 2016-08-06 ENCOUNTER — Ambulatory Visit: Payer: Managed Care, Other (non HMO) | Admitting: Anesthesiology

## 2016-08-06 ENCOUNTER — Ambulatory Visit
Admission: RE | Admit: 2016-08-06 | Discharge: 2016-08-06 | Disposition: A | Discharge status: Home / Self Care | Payer: Managed Care, Other (non HMO) | Source: Ambulatory Visit | Attending: Obstetrics and Gynecology | Admitting: Obstetrics and Gynecology

## 2016-08-06 DIAGNOSIS — Z9851 Tubal ligation status: Secondary | ICD-10-CM | POA: Diagnosis not present

## 2016-08-06 DIAGNOSIS — Z87891 Personal history of nicotine dependence: Secondary | ICD-10-CM | POA: Insufficient documentation

## 2016-08-06 DIAGNOSIS — R102 Pelvic and perineal pain: Secondary | ICD-10-CM | POA: Diagnosis not present

## 2016-08-06 DIAGNOSIS — N838 Other noninflammatory disorders of ovary, fallopian tube and broad ligament: Secondary | ICD-10-CM | POA: Diagnosis not present

## 2016-08-06 DIAGNOSIS — G8929 Other chronic pain: Secondary | ICD-10-CM | POA: Diagnosis not present

## 2016-08-06 DIAGNOSIS — Z9889 Other specified postprocedural states: Secondary | ICD-10-CM

## 2016-08-06 HISTORY — PX: LAPAROSCOPY: SHX197

## 2016-08-06 LAB — ABO/RH: ABO/RH(D): A POS

## 2016-08-06 LAB — POCT PREGNANCY, URINE: Preg Test, Ur: NEGATIVE

## 2016-08-06 SURGERY — LAPAROSCOPY, DIAGNOSTIC
Anesthesia: General

## 2016-08-06 MED ORDER — ONDANSETRON HCL 4 MG/2ML IJ SOLN
INTRAMUSCULAR | Status: AC
  Filled 2016-08-06: qty 2

## 2016-08-06 MED ORDER — PROPOFOL 10 MG/ML IV BOLUS
INTRAVENOUS | Status: AC
  Filled 2016-08-06: qty 20

## 2016-08-06 MED ORDER — OXYCODONE-ACETAMINOPHEN 5-325 MG PO TABS
1.0000 | ORAL_TABLET | ORAL | Status: DC | PRN
Start: 2016-08-06 — End: 2016-08-06
  Administered 2016-08-06: 1 via ORAL

## 2016-08-06 MED ORDER — ONDANSETRON HCL 4 MG/2ML IJ SOLN
INTRAMUSCULAR | Status: DC | PRN
  Administered 2016-08-06: 4 mg via INTRAVENOUS

## 2016-08-06 MED ORDER — FAMOTIDINE 20 MG PO TABS
20.0000 mg | ORAL_TABLET | Freq: Once | ORAL | Status: AC
  Administered 2016-08-06: 20 mg via ORAL

## 2016-08-06 MED ORDER — ONDANSETRON HCL 4 MG/2ML IJ SOLN: 4.0000 mg | Freq: Once | INTRAMUSCULAR | Status: DC | PRN

## 2016-08-06 MED ORDER — ROCURONIUM BROMIDE 50 MG/5ML IV SOLN
INTRAVENOUS | Status: AC
  Filled 2016-08-06: qty 1

## 2016-08-06 MED ORDER — LACTATED RINGERS IV SOLN
INTRAVENOUS | Status: DC
  Administered 2016-08-06: 12:00:00 via INTRAVENOUS

## 2016-08-06 MED ORDER — FENTANYL CITRATE (PF) 100 MCG/2ML IJ SOLN
INTRAMUSCULAR | Status: DC | PRN
  Administered 2016-08-06 (×2): 50 ug via INTRAVENOUS

## 2016-08-06 MED ORDER — ROCURONIUM BROMIDE 100 MG/10ML IV SOLN
INTRAVENOUS | Status: DC | PRN
  Administered 2016-08-06: 40 mg via INTRAVENOUS

## 2016-08-06 MED ORDER — OXYCODONE-ACETAMINOPHEN 5-325 MG PO TABS
ORAL_TABLET | ORAL | Status: AC
  Filled 2016-08-06: qty 1

## 2016-08-06 MED ORDER — DEXAMETHASONE SODIUM PHOSPHATE 10 MG/ML IJ SOLN
INTRAMUSCULAR | Status: DC | PRN
  Administered 2016-08-06: 10 mg via INTRAVENOUS

## 2016-08-06 MED ORDER — KETOROLAC TROMETHAMINE 30 MG/ML IJ SOLN
INTRAMUSCULAR | Status: DC | PRN
  Administered 2016-08-06: 30 mg via INTRAVENOUS

## 2016-08-06 MED ORDER — FENTANYL CITRATE (PF) 100 MCG/2ML IJ SOLN
INTRAMUSCULAR | Status: AC
  Administered 2016-08-06: 25 ug via INTRAVENOUS
  Filled 2016-08-06: qty 2

## 2016-08-06 MED ORDER — OXYCODONE-ACETAMINOPHEN 5-325 MG PO TABS: 1.0000 | ORAL_TABLET | ORAL | 0 refills | Status: DC | PRN

## 2016-08-06 MED ORDER — LACTATED RINGERS IV SOLN: INTRAVENOUS | Status: DC

## 2016-08-06 MED ORDER — SUGAMMADEX SODIUM 200 MG/2ML IV SOLN
INTRAVENOUS | Status: DC | PRN
  Administered 2016-08-06: 200 mg via INTRAVENOUS

## 2016-08-06 MED ORDER — LIDOCAINE HCL (CARDIAC) 20 MG/ML IV SOLN
INTRAVENOUS | Status: DC | PRN
  Administered 2016-08-06: 100 mg via INTRAVENOUS

## 2016-08-06 MED ORDER — PROPOFOL 10 MG/ML IV BOLUS
INTRAVENOUS | Status: DC | PRN
  Administered 2016-08-06: 150 mg via INTRAVENOUS

## 2016-08-06 MED ORDER — FAMOTIDINE 20 MG PO TABS
ORAL_TABLET | ORAL | Status: AC
  Administered 2016-08-06: 20 mg via ORAL
  Filled 2016-08-06: qty 1

## 2016-08-06 MED ORDER — FENTANYL CITRATE (PF) 100 MCG/2ML IJ SOLN
25.0000 ug | INTRAMUSCULAR | Status: AC | PRN
  Administered 2016-08-06 (×6): 25 ug via INTRAVENOUS

## 2016-08-06 MED ORDER — FENTANYL CITRATE (PF) 250 MCG/5ML IJ SOLN
INTRAMUSCULAR | Status: AC
  Filled 2016-08-06: qty 5

## 2016-08-06 MED ORDER — MIDAZOLAM HCL 2 MG/2ML IJ SOLN
INTRAMUSCULAR | Status: AC
  Filled 2016-08-06: qty 2

## 2016-08-06 MED ORDER — IBUPROFEN 800 MG PO TABS: 800.0000 mg | ORAL_TABLET | Freq: Three times a day (TID) | ORAL | 1 refills | Status: DC

## 2016-08-06 SURGICAL SUPPLY — 36 items
BLADE SURG SZ11 CARB STEEL (BLADE) ×3 IMPLANT
CANISTER SUCT 1200ML W/VALVE (MISCELLANEOUS) ×3 IMPLANT
CATH ROBINSON RED A/P 16FR (CATHETERS) ×3 IMPLANT
CHLORAPREP W/TINT 26ML (MISCELLANEOUS) ×3 IMPLANT
DRESSING TELFA 4X3 1S ST N-ADH (GAUZE/BANDAGES/DRESSINGS) ×9 IMPLANT
DRSG TEGADERM 2-3/8X2-3/4 SM (GAUZE/BANDAGES/DRESSINGS) ×9 IMPLANT
DRSG TEGADERM 4X4.75 (GAUZE/BANDAGES/DRESSINGS) ×9 IMPLANT
DRSG TELFA 3X8 NADH (GAUZE/BANDAGES/DRESSINGS) ×3 IMPLANT
GLOVE BIO SURGEON STRL SZ8 (GLOVE) ×3 IMPLANT
GLOVE INDICATOR 8.0 STRL GRN (GLOVE) ×3 IMPLANT
GOWN STRL REUS W/ TWL LRG LVL3 (GOWN DISPOSABLE) ×1 IMPLANT
GOWN STRL REUS W/ TWL XL LVL3 (GOWN DISPOSABLE) ×1 IMPLANT
GOWN STRL REUS W/TWL LRG LVL3 (GOWN DISPOSABLE) ×2
GOWN STRL REUS W/TWL XL LVL3 (GOWN DISPOSABLE) ×2
IRRIGATION STRYKERFLOW (MISCELLANEOUS) ×1 IMPLANT
IRRIGATOR STRYKERFLOW (MISCELLANEOUS) ×3
IV LACTATED RINGERS 1000ML (IV SOLUTION) ×3 IMPLANT
KIT PINK PAD W/HEAD ARE REST (MISCELLANEOUS) ×3
KIT PINK PAD W/HEAD ARM REST (MISCELLANEOUS) ×1 IMPLANT
KIT RM TURNOVER CYSTO AR (KITS) ×3 IMPLANT
LABEL OR SOLS (LABEL) IMPLANT
LIQUID BAND (GAUZE/BANDAGES/DRESSINGS) ×3 IMPLANT
NS IRRIG 500ML POUR BTL (IV SOLUTION) ×3 IMPLANT
PACK GYN LAPAROSCOPIC (MISCELLANEOUS) ×3 IMPLANT
PAD OB MATERNITY 4.3X12.25 (PERSONAL CARE ITEMS) ×3 IMPLANT
PAD PREP 24X41 OB/GYN DISP (PERSONAL CARE ITEMS) ×3 IMPLANT
POUCH ENDO CATCH 10MM SPEC (MISCELLANEOUS) IMPLANT
SCISSORS METZENBAUM CVD 33 (INSTRUMENTS) IMPLANT
SLEEVE ENDOPATH XCEL 5M (ENDOMECHANICALS) ×3 IMPLANT
SUT MNCRL 4-0 (SUTURE) ×2
SUT MNCRL 4-0 27XMFL (SUTURE) ×1
SUT VIC AB 0 UR5 27 (SUTURE) ×3 IMPLANT
SUTURE MNCRL 4-0 27XMF (SUTURE) ×1 IMPLANT
SYR 30ML LL (SYRINGE) ×3 IMPLANT
TROCAR XCEL NON-BLD 5MMX100MML (ENDOMECHANICALS) ×3 IMPLANT
TUBING INSUFFLATOR HI FLOW (MISCELLANEOUS) ×3 IMPLANT

## 2016-08-06 NOTE — OR Nursing (Signed)
1700 Nausea and pain have improved.  I just want to go home.

## 2016-08-06 NOTE — Anesthesia Post-op Follow-up Note (Cosign Needed)
Anesthesia QCDR form completed.        

## 2016-08-06 NOTE — Transfer of Care (Signed)
Immediate Anesthesia Transfer of Care Note  Patient: Kristen Meyer  Procedure(s) Performed: Procedure(s): LAPAROSCOPY DIAGNOSTIC (N/A)  Patient Location: PACU  Anesthesia Type:General  Level of Consciousness: sedated  Airway & Oxygen Therapy: Patient Spontanous Breathing  Post-op Assessment: Report given to RN  Post vital signs: Reviewed and stable  Last Vitals:  Vitals:   08/06/16 1209  BP: 115/65  Pulse: 92  Resp: 16  Temp: (!) 36 C    Last Pain:  Vitals:   08/06/16 1209  TempSrc: Tympanic  PainSc: 7          Complications: No apparent anesthesia complications

## 2016-08-06 NOTE — Anesthesia Procedure Notes (Signed)
Procedure Name: Intubation Date/Time: 08/06/2016 1:25 PM Performed by: [facility] Pre-anesthesia Checklist: Patient identified, Emergency Drugs available, Suction available and Patient being monitored Patient Re-evaluated:Patient Re-evaluated prior to inductionOxygen Delivery Method: Circle system utilized Preoxygenation: Pre-oxygenation with 100% oxygen Intubation Type: IV induction Ventilation: Mask ventilation without difficulty Laryngoscope Size: Mac and 3 Grade View: Grade I Tube type: Oral Tube size: 7.0 mm Number of attempts: 1 Airway Equipment and Method: Stylet Placement Confirmation: ETT inserted through vocal cords under direct vision,  positive ETCO2 and breath sounds checked- equal and bilateral Secured at: 21 cm Tube secured with: Tape Dental Injury: Teeth and Oropharynx as per pre-operative assessment

## 2016-08-06 NOTE — Discharge Instructions (Signed)

## 2016-08-06 NOTE — Anesthesia Preprocedure Evaluation (Signed)
Anesthesia Evaluation  Patient identified by MRN, date of birth, ID band Patient awake    Reviewed: Allergy & Precautions, NPO status , Patient's Chart, lab work & pertinent test results  History of Anesthesia Complications (+) PONV and history of anesthetic complications  Airway Mallampati: II  TM Distance: >3 FB     Dental   Pulmonary former smoker,    Pulmonary exam normal        Cardiovascular Normal cardiovascular exam     Neuro/Psych negative neurological ROS  negative psych ROS   GI/Hepatic negative GI ROS, Neg liver ROS,   Endo/Other  negative endocrine ROS  Renal/GU negative Renal ROS  Female GU complaint     Musculoskeletal negative musculoskeletal ROS (+)   Abdominal Normal abdominal exam  (+)   Peds  Hematology  (+) anemia ,   Anesthesia Other Findings Past Medical History: No date: Abnormal uterine bleeding No date: Complication of anesthesia No date: Ovarian cyst     Comment: left No date: PONV (postoperative nausea and vomiting)  Reproductive/Obstetrics negative OB ROS                             Anesthesia Physical Anesthesia Plan  ASA: II  Anesthesia Plan: General   Post-op Pain Management:    Induction: Intravenous  Airway Management Planned: Oral ETT  Additional Equipment:   Intra-op Plan:   Post-operative Plan: Extubation in OR  Informed Consent: I have reviewed the patients History and Physical, chart, labs and discussed the procedure including the risks, benefits and alternatives for the proposed anesthesia with the patient or authorized representative who has indicated his/her understanding and acceptance.   Dental advisory given  Plan Discussed with: CRNA and Surgeon  Anesthesia Plan Comments:         Anesthesia Quick Evaluation

## 2016-08-06 NOTE — H&P (View-Only) (Signed)
Subjective:  PREOPERATIVE HISTORY AND PHYSICAL  Date of surgery: 08/06/2016 Procedure: Laparoscopy with peritoneal biopsies Diagnoses: 1. Chronic pelvic pain 2. Status post endometrial ablation 3. 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 G2P2024female scheduled for Laparoscopy with peritoneal biopsies on 08/06/2016 to evaluate chronic pelvic pain and rule out endometriosis. Patient history includes the following details: 1. Pelvic pain - cyclic cramping and lower back pain, without bleeding. Interferes with her daily activities. Opted for endometrial ablation in 2015 "just to be done with my periods" which resulted in vaginal bleeding and pelvic pain until hysteroscope was performed in January 2016. Menstrual periods before 2015 were relatively normal with some heavy bleeding. No history of endometriosis.   Previous history notable for laparoscopic bilateral tubal banding with Falope-Rings greater than 20 years ago. Endometrial ablation in 2015 was reportedly associated with complications require surgery including hysteroscopy with resection of endometrium as well as laparoscopy with partial salpingectomy for hydrosalpinx.  Symptoms now include primarily cyclic cramping. No significant bladder symptoms. She does have constipation symptoms as well as diarrhea symptoms, cyclic, noted pre-and post pelvic cramping  2. Status post endometrial ablation 3. Status post tubal ligation   Gynecologic History No LMP recorded. Patient has had an ablation. Contraception: This patient has had an endometrial ablation.  Last Pap: 2-3 years ago. Doesn't recall abnormal results. Last mammogram: 3 years ago  OB History    Gravida Para Term Preterm AB Living   2 2 2     2    SAB TAB Ectopic Multiple Live Births           2     No LMP recorded. Patient has had an ablation.    Past Medical History:  Diagnosis Date  . Abnormal uterine bleeding   .  Ovarian cyst    left    Past Surgical History:  Procedure Laterality Date  . ABLATION     uterine  . BUNIONECTOMY Right 2008  . DILATION AND CURETTAGE OF UTERUS    . HYSTEROSCOPY    . SALPINGECTOMY Right    partial  . TONSILLECTOMY AND ADENOIDECTOMY  1988  . TUBAL LIGATION  1994    OB History  Gravida Para Term Preterm AB Living  2 2 2     2   SAB TAB Ectopic Multiple Live Births          2    # Outcome Date GA Lbr Len/2nd Weight Sex Delivery Anes PTL Lv  2 Term 1993   8 lb 12.8 oz (3.992 kg) M Vag-Spont   LIV  1 Term 1991   7 lb 2.1 oz (3.234 kg) F Vag-Spont   LIV      Social History   Social History  . Marital status: Married    Spouse name: N/A  . Number of children: N/A  . Years of education: N/A   Social History Main Topics  . Smoking status: Former Games developer  . Smokeless tobacco: Never Used     Comment: QUIT IN 37  . Alcohol use No  . Drug use: No  . Sexual activity: Yes    Birth control/ protection: Surgical   Other Topics Concern  . None   Social History Narrative  . None    Family History  Problem Relation Age of Onset  . COPD Mother   . Healthy Father   . Healthy Sister   . Healthy Brother   . Breast cancer Neg Hx   .  Ovarian cancer Neg Hx   . Colon cancer Neg Hx   . Diabetes Neg Hx   . Heart disease Neg Hx      (Not in a hospital admission)  Allergies  Allergen Reactions  . Amoxicillin Hives    Has patient had a PCN reaction causing immediate rash, facial/tongue/throat swelling, SOB or lightheadedness with hypotension: No Has patient had a PCN reaction causing severe rash involving mucus membranes or skin necrosis: Yes Has patient had a PCN reaction that required hospitalization No Has patient had a PCN reaction occurring within the last 10 years: No If all of the above answers are "NO", then may proceed with Cephalosporin use.   Marland Kitchen. Penicillins   . Clarithromycin Nausea And Vomiting and Rash  . Latex Itching and Rash    burns   . Sulfa Antibiotics Itching, Nausea And Vomiting and Rash    Review of Systems Constitutional: No recent fever/chills/sweats Respiratory: No recent cough/bronchitis Cardiovascular: No chest pain Gastrointestinal: No recent nausea/vomiting/diarrhea Genitourinary: No UTI symptoms; patient reports recent right lower quadrant achiness and stabbing pain extending towards the back Hematologic/lymphatic:No history of coagulopathy or recent blood thinner use    Objective:    BP 105/71   Pulse 73   Ht 5\' 7"  (1.702 m)   Wt 161 lb (73 kg)   BMI 25.22 kg/m   General:   Normal  Skin:   normal  HEENT:  Normal  Neck:  Supple without Adenopathy or Thyromegaly  Lungs:   Heart:              Breasts:   Abdomen:  Pelvis:  M/S   Extremeties:  Neuro:    clear to auscultation bilaterally   Normal without murmur   Not Examined   soft, non-tender; bowel sounds normal; no masses,  no organomegaly   Exam deferred to OR  No CVAT  Warm/Dry   Normal         01/31/2018PELVIC:             Bony pelvis is gynecoid             External Genitalia: Normal             BUS: Normal             Vagina: Normal             Cervix: Parous. Nabothian cyst at 9 o'clock.              Uterus: Normal size, shape,consistency, mobile. Some midline tenderness,2/4             Adnexa: Normal; nonpalpable nontender             RV: Normal external exam             Bladder: Nontender  Assessment:    1. Chronic pelvic pain, cyclic, status post endometrial ablation, status post tubal ligation 2. Cannot rule out endometriosis   Plan:  Laparoscopy with peritoneal biopsies   Preop counseling: The patient is to undergo laparoscopy with peritoneal biopsies to evaluate of the pain syndrome. She is understanding of the planned procedure and is aware of and is accepting of all surgical risks which include but are not limited to bleeding, infection, pelvic organ injury with need for repair, blood clot disorders,  anesthesia risks, etc. All questions have been answered. Informed consent is given. Patient is ready and willing to proceed with surgery as scheduled.  Herold HarmsMartin A Defrancesco, MD  Note: This  dictation was prepared with Dragon dictation along with smaller phrase technology. Any transcriptional errors that result from this process are unintentional.

## 2016-08-06 NOTE — Interval H&P Note (Signed)
History and Physical Interval Note:  08/06/2016 12:34 PM  Kristen Meyer  has presented today for surgery, with the diagnosis of PELVIC PAIN  The various methods of treatment have been discussed with the patient and family. After consideration of risks, benefits and other options for treatment, the patient has consented to  Procedure(s): LAPAROSCOPY DIAGNOSTIC (N/A) as a surgical intervention .  The patient's history has been reviewed, patient examined, no change in status, stable for surgery.  I have reviewed the patient's chart and labs.  Questions were answered to the patient's satisfaction.     Daphine DeutscherMartin A Caffie Sotto

## 2016-08-07 ENCOUNTER — Encounter: Payer: Self-pay | Admitting: Obstetrics and Gynecology

## 2016-08-07 NOTE — Op Note (Signed)
Jabrea A Abplanalp PROCEDURE DATE: 08/06/2016 4:58 PM  PREOPERATIVE DIAGNOSIS: PELVIC PAIN POSTOPERATIVE DIAGNOSIS: PELVIC PAIN PROCEDURE: Procedure(s): LAPAROSCOPY DIAGNOSTIC (N/A) with peritoneal Biopsies SURGEON:  Dr. Daphine DeutscherMartin A Nyron Mozer ASSISTANT: None ANESTHESIA: General Endotracheal  INDICATIONS: 46 y.o. [id number] with history of chronic pelvic pain concerning for endometriosis desiring surgical evaluation.   Patient is status post laparoscopic bilateral tubal banding with Falope-Rings many years ago. Patient is status post endometrial ablation, followed by laparoscopy with removal of right-sided hydrosalpinx. Patient has no onset cyclic pelvic pain.  FINDINGS:  Gynecoid pelvis. Small uterus with 3 mm vesicle noted on uterine fundus, normal ovaries bilaterally.left fallopian tube has evidence of prior Falope ring application. Right fallopian tube contains a 2 cm proximal stump with the remaining portion of the tube previously removed. There was a small powder burn implants on the right mesosalpinx which was biopsied. There was a clear vesicle on the uterine fundus which was biopsied   I/O's: Total I/O In: 1750 [P.O.:200; I.V.:1550] Out: 153 [Urine:150; Blood:3] SPECIMENS: Peritoneal biopsies 2 (uterine serosa-fundal; right mesosalpinx) COMPLICATIONS: None immediate COUNTS:  YES  PROCEDURE IN DETAIL: The patient was brought to the operating room where she was placed in the supine position.  General endotracheal anesthesia was induced without difficulty.  She was placed in the dorsal lithotomy position using bumblebee stirrups.  A ChloraPrep and Hibiclens, abdominal, perineal, intravaginal prep and drape was performed in standard fashion.  The timeout was completed.  The red Robison catheter was used to drain the bladder of clear urine.  A weighted speculum was placed into the vagina and a Hulka tenaculum was placed onto the cervix to facilitate uterine manipulation. Laparoscopy was  performed in standard fashion.  The Optiview 5 mm trocar and sleeve were placed through a 5 mm subumbilical incision directly into the abdominal pelvic cavity, without evidence of bowel or vascular injury. One Additional 5 mm port was placed in the suprapubic region under direct visualization.  The above noted findings were photo documented. 2 biopsies were taken with biopsy forceps as described. Kleppinger bipolar forceps were used to optimize hemostasis at the biopsy sites. The pelvis was irrigated with saline and the irrigant fluid was aspirated. Upon completion of the procedures and inspection for hemostasis, the surgery was complete with all instrumentation being removed from the abdominal pelvic cavity.  The pneumoperitoneum was released.  The incisions were closed with Dermabond glue.  The patient was awakened, extubated, and taken to the recovery room in satisfactory condition.  Herold HarmsMartin A Pierra Skora, MD [facility]

## 2016-08-08 DIAGNOSIS — Z0289 Encounter for other administrative examinations: Secondary | ICD-10-CM

## 2016-08-08 LAB — SURGICAL PATHOLOGY

## 2016-08-08 NOTE — Anesthesia Postprocedure Evaluation (Signed)
Anesthesia Post Note  Patient: Kristen Meyer  Procedure(s) Performed: Procedure(s) (LRB): LAPAROSCOPY DIAGNOSTIC (N/A)  Patient location during evaluation: PACU Anesthesia Type: General Level of consciousness: awake and alert and oriented Pain management: pain level controlled Vital Signs Assessment: post-procedure vital signs reviewed and stable Respiratory status: spontaneous breathing Cardiovascular status: blood pressure returned to baseline Anesthetic complications: no     Last Vitals:  Vitals:   08/06/16 1526 08/06/16 1640  BP: 109/66 (!) 95/54  Pulse: 74 66  Resp: 16 16  Temp: 36.8 C     Last Pain:  Vitals:   08/06/16 1640  TempSrc:   PainSc: 2                  Asuzena Weis

## 2016-08-14 ENCOUNTER — Ambulatory Visit (INDEPENDENT_AMBULATORY_CARE_PROVIDER_SITE_OTHER): Payer: Managed Care, Other (non HMO) | Admitting: Obstetrics and Gynecology

## 2016-08-14 ENCOUNTER — Encounter: Payer: Self-pay | Admitting: Obstetrics and Gynecology

## 2016-08-14 VITALS — BP 95/63 | HR 81 | Ht 67.0 in | Wt 157.6 lb

## 2016-08-14 DIAGNOSIS — R102 Pelvic and perineal pain: Secondary | ICD-10-CM

## 2016-08-14 DIAGNOSIS — R221 Localized swelling, mass and lump, neck: Secondary | ICD-10-CM

## 2016-08-14 DIAGNOSIS — Z09 Encounter for follow-up examination after completed treatment for conditions other than malignant neoplasm: Secondary | ICD-10-CM

## 2016-08-14 DIAGNOSIS — Z9851 Tubal ligation status: Secondary | ICD-10-CM

## 2016-08-14 DIAGNOSIS — Z9889 Other specified postprocedural states: Secondary | ICD-10-CM

## 2016-08-14 NOTE — Patient Instructions (Signed)
1. Monitor pelvic pain symptoms over the next 3 months 2. Return in 3 months for follow-up 3. Referral to ENT for palpable right anterior cervical lymph node 3 x 2 cm

## 2016-08-14 NOTE — Progress Notes (Signed)
Chief complaint: 1. One week postop check 2. Status post laparoscopy with peritoneal biopsies for evaluation of pelvic pain  Patient presents for 1 week postop check. Since surgery patient has experienced waxing and waning abdominal pelvic pain with severe bloating, burning, constipation and diarrhea. Patient did have a colonoscopy 2 years ago which was normal. CT scan prior to the colonoscopy was also normal. She has never been diagnosed with irritable bowel syndrome.  Since surgery patient has noted development of a right neck lump in the anterior cervical lymph node chain. No upper respiratory symptoms.  Pathology from surgery: (No evidence of endometriosis) DIAGNOSIS:  A. RIGHT MESOSALPINX; BIOPSY:  - DENSE FIBROUS ADHESIONS CONTAINING POLARIZABLE PIGMENTED MATERIAL AND  ASSOCIATED FOREIGN BODY GIANT CELL REACTION.   B. UTERINE SEROSA, FUNDAL; BIOPSY:  - MYOMETRIAL AND SEROSAL TISSUE.     OBJECTIVE: BP 95/63   Pulse 81   Ht 5\' 7"  (1.702 m)   Wt 157 lb 9.6 oz (71.5 kg)   LMP  (LMP Unknown)   BMI 24.68 kg/m   Pleasant female in no acute distress Neck: Right anterior cervical lymph node 3 x 2 cm, mobile, nontender, just inferior to the cricothyroid membrane; no other lymphadenopathy is appreciated in the supraclavicular region, posterior auricular region, or posterior cervical chain. Abdomen: Soft, nontender; laparoscopy incision sites are well healed.  ASSESSMENT: 1. 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 2. GI symptoms, uncertain etiology, sounding similar to irritable bowel syndrome. 3. New onset right anterior cervical chain lymph node enlargement, unclear etiology  PLAN: 1. Patient is to monitor pelvic pain symptoms and GI symptoms return in 3 months for follow-up 2. Patient was offered Depo-Lupron therapy but declined at this time 3. Referral to ENT  for evaluation of isolated anterior cervical chain suspected lymph node enlargement  A total of 15 minutes were spent face-to-face with the patient during this encounter and over half of that time dealt with counseling and coordination of care.  Herold HarmsMartin A Lexine Jaspers, MD

## 2016-09-06 ENCOUNTER — Other Ambulatory Visit: Payer: Self-pay | Admitting: Otolaryngology

## 2016-09-06 DIAGNOSIS — E041 Nontoxic single thyroid nodule: Secondary | ICD-10-CM

## 2016-09-11 ENCOUNTER — Ambulatory Visit
Admission: RE | Admit: 2016-09-11 | Discharge: 2016-09-11 | Disposition: A | Discharge status: Home / Self Care | Payer: Managed Care, Other (non HMO) | Source: Ambulatory Visit | Attending: Otolaryngology | Admitting: Otolaryngology

## 2016-09-11 DIAGNOSIS — E041 Nontoxic single thyroid nodule: Secondary | ICD-10-CM | POA: Insufficient documentation

## 2016-09-21 ENCOUNTER — Other Ambulatory Visit: Payer: Self-pay | Admitting: Otolaryngology

## 2016-09-21 DIAGNOSIS — E041 Nontoxic single thyroid nodule: Secondary | ICD-10-CM

## 2016-09-27 ENCOUNTER — Ambulatory Visit: Payer: Managed Care, Other (non HMO)

## 2016-09-27 ENCOUNTER — Ambulatory Visit
Admission: RE | Admit: 2016-09-27 | Discharge: 2016-09-27 | Disposition: A | Discharge status: Home / Self Care | Payer: Managed Care, Other (non HMO) | Source: Ambulatory Visit | Attending: Otolaryngology | Admitting: Otolaryngology

## 2016-09-27 DIAGNOSIS — E041 Nontoxic single thyroid nodule: Secondary | ICD-10-CM | POA: Diagnosis not present

## 2016-09-27 NOTE — Procedures (Signed)
US guided right thyroid nodule FNA.  5 FNAs performed.  Minimal bleeding and no immediate complication.

## 2016-09-28 LAB — CYTOLOGY - NON PAP

## 2016-11-14 ENCOUNTER — Encounter: Payer: Managed Care, Other (non HMO) | Admitting: Obstetrics and Gynecology

## 2017-02-08 ENCOUNTER — Encounter: Payer: Self-pay | Admitting: Family Medicine

## 2017-02-08 ENCOUNTER — Ambulatory Visit (INDEPENDENT_AMBULATORY_CARE_PROVIDER_SITE_OTHER): Payer: Managed Care, Other (non HMO) | Admitting: Family Medicine

## 2017-02-08 VITALS — BP 102/68 | HR 69 | Temp 97.8°F | Ht 67.0 in | Wt 162.0 lb

## 2017-02-08 DIAGNOSIS — Z23 Encounter for immunization: Secondary | ICD-10-CM | POA: Diagnosis not present

## 2017-02-08 DIAGNOSIS — Z9889 Other specified postprocedural states: Secondary | ICD-10-CM

## 2017-02-08 DIAGNOSIS — Z1231 Encounter for screening mammogram for malignant neoplasm of breast: Secondary | ICD-10-CM

## 2017-02-08 DIAGNOSIS — Z124 Encounter for screening for malignant neoplasm of cervix: Secondary | ICD-10-CM

## 2017-02-08 DIAGNOSIS — E041 Nontoxic single thyroid nodule: Secondary | ICD-10-CM | POA: Diagnosis not present

## 2017-02-08 DIAGNOSIS — B353 Tinea pedis: Secondary | ICD-10-CM | POA: Diagnosis not present

## 2017-02-08 DIAGNOSIS — Z0000 Encounter for general adult medical examination without abnormal findings: Secondary | ICD-10-CM

## 2017-02-08 DIAGNOSIS — Z1239 Encounter for other screening for malignant neoplasm of breast: Secondary | ICD-10-CM

## 2017-02-08 NOTE — Progress Notes (Signed)
Patient: Kristen OharaChristina A Hinderer, Female    DOB: 09-09-1970, 46 y.o.   MRN: [id number] Visit Date: 02/08/2017  Today's Provider: Dortha Kernennis Chrismon, PA   Chief Complaint  Patient presents with  . Annual Exam   Subjective:    Annual physical exam Kristen Meyer is a 46 y.o. female who presents today for health maintenance and complete physical. She feels well. She reports exercising daily. She reports she is sleeping well.  -----------------------------------------------------------------   Review of Systems  Constitutional: Negative.   HENT: Negative.   Eyes: Negative.   Respiratory: Negative.   Cardiovascular: Negative.   Gastrointestinal: Negative.   Endocrine: Negative.   Genitourinary: Negative.   Musculoskeletal: Negative.   Skin: Negative.   Allergic/Immunologic: Negative.   Neurological: Negative.   Hematological: Negative.   Psychiatric/Behavioral: Negative.     Social History      She  reports that she quit smoking about 30 years ago. She has never used smokeless tobacco. She reports that she does not drink alcohol or use drugs.       Social History   Social History  . Marital status: Divorced    Spouse name: N/A  . Number of children: N/A  . Years of education: N/A   Social History Main Topics  . Smoking status: Former Smoker    Quit date: 08/02/1986  . Smokeless tobacco: Never Used     Comment: QUIT IN [id number]  . Alcohol use No  . Drug use: No  . Sexual activity: Yes    Birth control/ protection: Surgical   Other Topics Concern  . None   Social History Narrative  . None    Past Medical History:  Diagnosis Date  . Abnormal uterine bleeding   . Complication of anesthesia   . Ovarian cyst    left  . PONV (postoperative nausea and vomiting)   . Thyroid nodule      Patient Active Problem List   Diagnosis Date Noted  . Pelvic pain 07/12/2016  . Hematuria 07/12/2016  . Status post endometrial ablation 07/12/2016  . Status post  tubal ligation 07/12/2016  . Allergic rhinitis 11/23/2015  . History of changes in urinary output 11/23/2015  . Mild reactive airways disease 11/23/2015  . CN (constipation) 02/24/2015  . Acid indigestion 02/24/2015  . LBP (low back pain) 02/24/2015  . Galactorrhea not associated with childbirth 02/22/2009  . Enthesopathy 10/28/2008  . Anemia, iron deficiency 03/11/2007    Past Surgical History:  Procedure Laterality Date  . ABLATION     uterine  . BREAST ENHANCEMENT SURGERY    . BUNIONECTOMY Right 2008  . DILATION AND CURETTAGE OF UTERUS    . HYSTEROSCOPY    . LAPAROSCOPY N/A 08/06/2016   Procedure: LAPAROSCOPY DIAGNOSTIC;  Surgeon: Herold HarmsMartin A Defrancesco, MD;  Location: [facility] ORS;  Service: Gynecology;  Laterality: N/A;  . SALPINGECTOMY Right    partial  . TONSILLECTOMY AND ADENOIDECTOMY  1988  . TUBAL LIGATION  1994    Family History        Family Status  Relation Status  . Mother Alive  . Father Alive  . Sister Alive  . Brother Alive  . Neg Hx (Not Specified)        Her family history includes COPD in her mother; Healthy in her brother, father, and sister.     Allergies  Allergen Reactions  . Amoxicillin Hives    Has patient had a PCN reaction causing immediate rash, facial/tongue/throat swelling,  SOB or lightheadedness with hypotension: No Has patient had a PCN reaction causing severe rash involving mucus membranes or skin necrosis: Yes Has patient had a PCN reaction that required hospitalization No Has patient had a PCN reaction occurring within the last 10 years: No If all of the above answers are "NO", then may proceed with Cephalosporin use.   Marland Kitchen Penicillins   . Phenergan [Promethazine Hcl] Nausea And Vomiting  . Clarithromycin Nausea And Vomiting and Rash  . Latex Itching and Rash    burns  . Sulfa Antibiotics Itching, Nausea And Vomiting and Rash    Other reaction(s): Unknown  . Transderm-Scop [Scopolamine] Rash    n/v     Current Outpatient  Prescriptions:  .  cyanocobalamin 1000 MCG tablet, Take 1,000 mcg by mouth daily., Disp: , Rfl:  .  ibuprofen (ADVIL,MOTRIN) 800 MG tablet, Take 1 tablet (800 mg total) by mouth 3 (three) times daily. (Patient not taking: Reported on 02/08/2017), Disp: 30 tablet, Rfl: 1   Patient Care Team: Chrismon, Jodell Cipro, PA as PCP - General (Physician Assistant)      Objective:   Vitals: BP 102/68 (BP Location: Left Arm, Patient Position: Sitting, Cuff Size: Normal)   Pulse 69   Temp 97.8 F (36.6 C) (Oral)   Ht 5\' 7"  (1.702 m)   Wt 162 lb (73.5 kg)   SpO2 98%   BMI 25.37 kg/m    Wt Readings from Last 3 Encounters:  02/08/17 162 lb (73.5 kg)  09/27/16 163 lb (73.9 kg)  08/14/16 157 lb 9.6 oz (71.5 kg)   Vitals:   02/08/17 0917  BP: 102/68  Pulse: 69  Temp: 97.8 F (36.6 C)  TempSrc: Oral  SpO2: 98%  Weight: 162 lb (73.5 kg)  Height: 5\' 7"  (1.702 m)    Physical Exam  Constitutional: She is oriented to person, place, and time. She appears well-developed and well-nourished.  HENT:  Head: Normocephalic and atraumatic.  Right Ear: External ear normal.  Left Ear: External ear normal.  Nose: Nose normal.  Mouth/Throat: Oropharynx is clear and moist.  Eyes: Pupils are equal, round, and reactive to light. Conjunctivae and EOM are normal. Right eye exhibits no discharge.  Neck: Normal range of motion. Neck supple. No tracheal deviation present.  Single benign right mid lobe thyroid nodule - FNA 09-27-16  Cardiovascular: Normal rate, regular rhythm, normal heart sounds and intact distal pulses.   No murmur heard. Pulmonary/Chest: Effort normal and breath sounds normal. No respiratory distress. She has no wheezes. She has no rales. She exhibits no tenderness.  Abdominal: Soft. She exhibits no distension and no mass. There is no tenderness. There is no rebound and no guarding.  Genitourinary: Vagina normal and uterus normal. Rectal exam shows guaiac negative stool. No vaginal discharge  found.  Musculoskeletal: Normal range of motion. She exhibits no edema or tenderness.  Lymphadenopathy:    She has no cervical adenopathy.  Neurological: She is alert and oriented to person, place, and time. She has normal reflexes. No cranial nerve deficit. She exhibits normal muscle tone. Coordination normal.  Skin: Skin is warm and dry. No rash noted. No erythema.  Psychiatric: She has a normal mood and affect. Her behavior is normal. Judgment and thought content normal.    Depression Screen PHQ 2/9 Scores 11/23/2015  PHQ - 2 Score 0   Assessment & Plan:     Routine Health Maintenance and Physical Exam  Exercise Activities and Dietary recommendations Goals    None  Health Maintenance  Topic Date Due  . TETANUS/TDAP  04/24/1990  . PAP SMEAR  03/07/2012  . INFLUENZA VACCINE  02/08/2018 (Originally 01/09/2017)  . HIV Screening  Completed     Discussed health benefits of physical activity, and encouraged her to engage in regular exercise appropriate for her age and condition.    -------------------------------------------------------------------- 1. Annual physical exam Heathy 46 year old female. No significant complaints. HIV test negative on 08-02-16 and had a colonoscopy by Dr. Shelle Iron (GI) that was reported as normal when having pelvic pain evaluated. No recurrence of pelvic pain since ablation and laparoscopic evaluation earlier this year. Will get routine labs and give Tdap up date. Recheck pending lab reports. - CBC With Differential - Lipid panel - Comprehensive metabolic panel  2. Thyroid nodule Had FNA 09-27-16 of right mid lobe of thyroid. Cytology reported as benign follicular nodule. Recheck labs. Was not treated with thyroid supplement. - CBC With Differential - Lipid panel - TSH - Comprehensive metabolic panel  3. Tinea pedis of left foot Small spot on the left heel medially with minimal itching. May use Lotrimin-AF with Hydrocortisone cream (equal parts)  BID for 2 weeks. Wash feet daily and may use Zeasorb-AF powder if wearing shoes. Recheck prn.  4. Status post endometrial ablation Done 07-12-16 by Dr. Tiburcio Pea (GYN) to treat DUB. No pathology on cytology report.  5. Need for Tdap vaccination -Tdap vaccine greater than or equal to 7yo IM  6. Screening for breast cancer No masses or lymphadenopathy on exam today. Schedule screening mammograms. - MM Digital Screening  7. Pap smear for cervical cancer screening Minimal menstrual symptoms with very little period since endometrial ablation in Feb. 2018. Normal exam today. - Pap IG (Image Guided)    Dortha Kern, PA  [facility]

## 2017-02-09 LAB — COMPREHENSIVE METABOLIC PANEL
ALBUMIN: 4.4 g/dL (ref 3.5–5.5)
ALK PHOS: 60 IU/L (ref 39–117)
ALT: 10 IU/L (ref 0–32)
AST: 17 IU/L (ref 0–40)
Albumin/Globulin Ratio: 1.7 (ref 1.2–2.2)
BUN / CREAT RATIO: 11 (ref 9–23)
BUN: 9 mg/dL (ref 6–24)
Bilirubin Total: 0.4 mg/dL (ref 0.0–1.2)
CO2: 22 mmol/L (ref 20–29)
CREATININE: 0.85 mg/dL (ref 0.57–1.00)
Calcium: 9.2 mg/dL (ref 8.7–10.2)
Chloride: 101 mmol/L (ref 96–106)
GFR calc non Af Amer: 83 mL/min/{1.73_m2} (ref >59)
GFR, EST AFRICAN AMERICAN: 96 mL/min/{1.73_m2} (ref >59)
GLUCOSE: 92 mg/dL (ref 65–99)
Globulin, Total: 2.6 g/dL (ref 1.5–4.5)
Potassium: 4 mmol/L (ref 3.5–5.2)
Sodium: 140 mmol/L (ref 134–144)
TOTAL PROTEIN: 7 g/dL (ref 6.0–8.5)

## 2017-02-09 LAB — CBC WITH DIFFERENTIAL
BASOS ABS: 0.1 10*3/uL (ref 0.0–0.2)
Basos: 1 %
EOS (ABSOLUTE): 0.2 10*3/uL (ref 0.0–0.4)
Eos: 4 %
Hematocrit: 38.8 % (ref 34.0–46.6)
Hemoglobin: 13 g/dL (ref 11.1–15.9)
Immature Grans (Abs): 0 10*3/uL (ref 0.0–0.1)
Immature Granulocytes: 0 %
LYMPHS ABS: 1.7 10*3/uL (ref 0.7–3.1)
LYMPHS: 26 %
MCH: 30.6 pg (ref 26.6–33.0)
MCHC: 33.5 g/dL (ref 31.5–35.7)
MCV: 91 fL (ref 79–97)
MONOS ABS: 0.5 10*3/uL (ref 0.1–0.9)
Monocytes: 7 %
NEUTROS ABS: 4.1 10*3/uL (ref 1.4–7.0)
NEUTROS PCT: 62 %
RBC: 4.25 x10E6/uL (ref 3.77–5.28)
RDW: 13 % (ref 12.3–15.4)
WBC: 6.6 10*3/uL (ref 3.4–10.8)

## 2017-02-09 LAB — LIPID PANEL
CHOLESTEROL TOTAL: 232 mg/dL — AB (ref 100–199)
Chol/HDL Ratio: 5.8 ratio — ABNORMAL HIGH (ref 0.0–4.4)
HDL: 40 mg/dL (ref >39)
LDL Calculated: 162 mg/dL — ABNORMAL HIGH (ref 0–99)
Triglycerides: 149 mg/dL (ref 0–149)
VLDL Cholesterol Cal: 30 mg/dL (ref 5–40)

## 2017-02-09 LAB — TSH: TSH: 3.36 u[IU]/mL (ref 0.450–4.500)

## 2017-02-09 LAB — T3, FREE: T3, Free: 3 pg/mL (ref 2.0–4.4)

## 2017-02-09 LAB — T4: T4 TOTAL: 7.5 ug/dL (ref 4.5–12.0)

## 2017-02-12 ENCOUNTER — Encounter: Payer: Self-pay | Admitting: Family Medicine

## 2017-02-12 ENCOUNTER — Telehealth: Payer: Self-pay

## 2017-02-12 LAB — PAP IG (IMAGE GUIDED): PAP Smear Comment: 0

## 2017-02-12 LAB — PLEASE NOTE

## 2017-02-12 NOTE — Telephone Encounter (Signed)
-----   Message from Tamsen Roersennis E Chrismon, [place] sent at 02/12/2017  8:18 AM EDT ----- All blood tests normal except total cholesterol and LDL elevations. Recommend low fat diet, regular exercise, Krill Oil qd and Red Yeast Rice qd. Recheck progress in 3 months. Awaiting final PAP report.

## 2017-02-12 NOTE — Telephone Encounter (Signed)
Patient advised. 3 month follow up scheduled.  

## 2017-05-14 ENCOUNTER — Ambulatory Visit: Payer: Managed Care, Other (non HMO) | Admitting: Family Medicine

## 2017-05-14 ENCOUNTER — Encounter: Payer: Self-pay | Admitting: Family Medicine

## 2017-05-14 VITALS — BP 100/62 | HR 74 | Temp 97.7°F | Wt 161.8 lb

## 2017-05-14 DIAGNOSIS — E7800 Pure hypercholesterolemia, unspecified: Secondary | ICD-10-CM

## 2017-05-14 DIAGNOSIS — Z87898 Personal history of other specified conditions: Secondary | ICD-10-CM | POA: Diagnosis not present

## 2017-05-14 DIAGNOSIS — E041 Nontoxic single thyroid nodule: Secondary | ICD-10-CM

## 2017-05-14 DIAGNOSIS — J301 Allergic rhinitis due to pollen: Secondary | ICD-10-CM | POA: Diagnosis not present

## 2017-05-14 NOTE — Progress Notes (Signed)
Patient: Kristen OharaChristina A Schimek Female    DOB: [birth year]   46 y.o.   MRN: [id number] Visit Date: 05/14/2017  Today's Provider: Dortha Kernennis Moe Graca, PA   Chief Complaint  Patient presents with  . Hyperlipidemia  . Follow-up   Subjective:    HPI  Lipid/Cholesterol, Follow-up:   Last seen for this 3 months ago.  Management changes since that visit include recommended low fat diet,exercise, krill oil and red yeast rice. . Last Lipid Panel:    Component Value Date/Time   CHOL 232 (H) 02/08/2017 1103   TRIG 149 02/08/2017 1103   HDL 40 02/08/2017 1103   CHOLHDL 5.8 (H) 02/08/2017 1103   LDLCALC 162 (H) 02/08/2017 1103    She reports poor compliance with treatment. She is having side effects. She states she discontinued medication She felt like she was having anxiety attacks when taking medication. She is not exercising. She is following a low fat diet periodically.  Current symptoms include none  Weight trend: stable Prior visit with dietician: no Current exercise: none   Wt Readings from Last 3 Encounters:  05/14/17 161 lb 12.8 oz (73.4 kg)  02/08/17 162 lb (73.5 kg)  09/27/16 163 lb (73.9 kg)    ------------------------------------------------------------------- Past Medical History:  Diagnosis Date  . Abnormal uterine bleeding   . Complication of anesthesia   . Ovarian cyst    left  . PONV (postoperative nausea and vomiting)   . Thyroid nodule    Past Surgical History:  Procedure Laterality Date  . ABLATION     uterine  . BREAST ENHANCEMENT SURGERY    . BUNIONECTOMY Right 2008  . DILATION AND CURETTAGE OF UTERUS    . HYSTEROSCOPY    . LAPAROSCOPY N/A 08/06/2016   Procedure: LAPAROSCOPY DIAGNOSTIC;  Surgeon: Herold HarmsMartin A Defrancesco, MD;  Location: [facility] ORS;  Service: Gynecology;  Laterality: N/A;  . SALPINGECTOMY Right    partial  . TONSILLECTOMY AND ADENOIDECTOMY  1988  . TUBAL LIGATION  1994   Family History  Problem Relation Age of Onset  . COPD Mother    . Healthy Father   . Healthy Sister   . Healthy Brother   . Breast cancer Neg Hx   . Ovarian cancer Neg Hx   . Colon cancer Neg Hx   . Diabetes Neg Hx   . Heart disease Neg Hx    Allergies  Allergen Reactions  . Amoxicillin Hives    Has patient had a PCN reaction causing immediate rash, facial/tongue/throat swelling, SOB or lightheadedness with hypotension: No Has patient had a PCN reaction causing severe rash involving mucus membranes or skin necrosis: Yes Has patient had a PCN reaction that required hospitalization No Has patient had a PCN reaction occurring within the last 10 years: No If all of the above answers are "NO", then may proceed with Cephalosporin use.   Marland Kitchen. Penicillins   . Phenergan [Promethazine Hcl] Nausea And Vomiting  . Clarithromycin Nausea And Vomiting and Rash  . Latex Itching and Rash    burns  . Sulfa Antibiotics Itching, Nausea And Vomiting and Rash    Other reaction(s): Unknown  . Transderm-Scop [Scopolamine] Rash    n/v    Current Outpatient Medications:  .  cyanocobalamin 1000 MCG tablet, Take 1,000 mcg by mouth daily., Disp: , Rfl:  .  ibuprofen (ADVIL,MOTRIN) 800 MG tablet, Take 1 tablet (800 mg total) by mouth 3 (three) times daily. (Patient not taking: Reported on 02/08/2017), Disp: 30 tablet, Rfl: 1  Review of Systems  Constitutional: Negative.   Respiratory: Negative.   Cardiovascular: Negative.   Neurological: Positive for dizziness.    Social History   Tobacco Use  . Smoking status: Former Smoker    Last attempt to quit: 08/02/1986    Years since quitting: 30.8  . Smokeless tobacco: Never Used  . Tobacco comment: QUIT IN 1992  Substance Use Topics  . Alcohol use: No    Alcohol/week: 0.0 oz   Objective:   BP 100/62 (BP Location: Right Arm, Patient Position: Sitting, Cuff Size: Normal)   Pulse 74   Temp 97.7 F (36.5 C) (Oral)   Wt 161 lb 12.8 oz (73.4 kg)   SpO2 97%   BMI 25.34 kg/m  Wt Readings from Last 3 Encounters:    05/14/17 161 lb 12.8 oz (73.4 kg)  02/08/17 162 lb (73.5 kg)  09/27/16 163 lb (73.9 kg)    Physical Exam  Constitutional: She is oriented to person, place, and time. She appears well-developed and well-nourished. No distress.  HENT:  Head: Normocephalic and atraumatic.  Right Ear: Hearing and external ear normal.  Left Ear: Hearing and external ear normal.  Nose: Nose normal.  Mouth/Throat: Oropharynx is clear and moist.  Eyes: Conjunctivae and lids are normal. Right eye exhibits no discharge. Left eye exhibits no discharge. No scleral icterus.  Neck: Neck supple.  Cardiovascular: Normal rate and regular rhythm.  Pulmonary/Chest: Effort normal and breath sounds normal. No respiratory distress.  Abdominal: Soft. Bowel sounds are normal.  Musculoskeletal: Normal range of motion.  Neurological: She is alert and oriented to person, place, and time.  Skin: Skin is intact. No lesion and no rash noted.  Psychiatric: She has a normal mood and affect. Her speech is normal and behavior is normal. Thought content normal.      Assessment & Plan:     1. Hypercholesterolemia Lipid Panel     Component Value Date/Time   CHOL 232 (H) 02/08/2017 1103   TRIG 149 02/08/2017 1103   HDL 40 02/08/2017 1103   CHOLHDL 5.8 (H) 02/08/2017 1103   LDLCALC 162 (H) 02/08/2017 1103   Trying to follow a low fat diet, exercise regularly and increased fiber intake. Tried Red Yeast Rice but developed fatigue and muscle aches. After stopping that, the symptoms went away. Tried it one more time, but the same thing happened. Will check labs. May need to consider Zetia (probably can't take statin with this reaction to Red Yeast Rice). - CBC with Differential/Platelet - Lipid panel - Comprehensive metabolic panel  2. Allergic rhinitis due to pollen, unspecified seasonality Occasional sneezing and congestion. Recommend antihistamine and nasal steroid spray prn. Check CBC with diff. May need follow up with  ENT/allergist if symptoms persist. - CBC with Differential/Platelet  3. Hx of vertigo Had an episode of vertigo and landed in a co-worker's lap on 05-03-17. Off balance and nauseated for 4-5 minutes. Had some nasal congestion, sneezing, PND, rhinorrhea and stopped up sensation on the right that day. Using allergy and cold medications without a return of symptoms. Will check labs. No dizziness or hearing loss today. - CBC with Differential/Platelet - Comprehensive metabolic panel  4. Thyroid nodule FNA of right lobe nodule on 09-27-16 by Dr. Willeen CassBennett (ENT) reported as benign. Still present but unchanged. No exophthalmos, edema, dysphagia, palpitations or weight gain.        Dortha Kernennis Dangela How, PA  [facility]

## 2018-02-18 ENCOUNTER — Ambulatory Visit: Payer: Managed Care, Other (non HMO) | Admitting: Family Medicine

## 2018-02-18 ENCOUNTER — Encounter: Payer: Self-pay | Admitting: Family Medicine

## 2018-02-18 VITALS — BP 100/64 | HR 83 | Temp 98.6°F | Wt 156.0 lb

## 2018-02-18 DIAGNOSIS — M5441 Lumbago with sciatica, right side: Secondary | ICD-10-CM

## 2018-02-18 DIAGNOSIS — M5442 Lumbago with sciatica, left side: Secondary | ICD-10-CM | POA: Diagnosis not present

## 2018-02-18 DIAGNOSIS — Z23 Encounter for immunization: Secondary | ICD-10-CM

## 2018-02-18 DIAGNOSIS — R350 Frequency of micturition: Secondary | ICD-10-CM | POA: Diagnosis not present

## 2018-02-18 MED ORDER — IBUPROFEN 800 MG PO TABS: 800.0000 mg | ORAL_TABLET | Freq: Three times a day (TID) | ORAL | 1 refills | Status: AC

## 2018-02-18 MED ORDER — METHOCARBAMOL 750 MG PO TABS
750.0000 mg | ORAL_TABLET | Freq: Four times a day (QID) | ORAL | 1 refills | Status: DC
Start: 2018-02-18 — End: 2018-05-15

## 2018-02-18 NOTE — Progress Notes (Signed)
Patient: Kristen Meyer Female    DOB: 09/22/1970   47 y.o.   MRN: [id number] Visit Date: 02/18/2018  Today's Provider: Dortha Kern, PA   Chief Complaint  Patient presents with  . Back Pain   Subjective:    Back Pain  This is a new problem. The current episode started in the past 7 days (Pt reports doing some yard work over the weekend.). The problem occurs constantly. The problem has been gradually worsening since onset. The pain is present in the lumbar spine. The quality of the pain is described as burning. The pain radiates to the left foot and right foot. The symptoms are aggravated by lying down and sitting. Associated symptoms include abdominal pain, headaches, leg pain, numbness, perianal numbness, tingling and weakness. Pertinent negatives include no dysuria. She has tried ice, NSAIDs, heat and home exercises for the symptoms. The treatment provided mild relief.      Past Medical History:  Diagnosis Date  . Abnormal uterine bleeding   . Complication of anesthesia   . Ovarian cyst    left  . PONV (postoperative nausea and vomiting)   . Thyroid nodule    Past Surgical History:  Procedure Laterality Date  . ABLATION     uterine  . BREAST ENHANCEMENT SURGERY    . BUNIONECTOMY Right 2008  . DILATION AND CURETTAGE OF UTERUS    . HYSTEROSCOPY    . LAPAROSCOPY N/A 08/06/2016   Procedure: LAPAROSCOPY DIAGNOSTIC;  Surgeon: Herold Harms, MD;  Location: [facility] ORS;  Service: Gynecology;  Laterality: N/A;  . SALPINGECTOMY Right    partial  . TONSILLECTOMY AND ADENOIDECTOMY  1988  . TUBAL LIGATION  1994   Family History  Problem Relation Age of Onset  . COPD Mother   . Healthy Father   . Healthy Sister   . Healthy Brother   . Breast cancer Neg Hx   . Ovarian cancer Neg Hx   . Colon cancer Neg Hx   . Diabetes Neg Hx   . Heart disease Neg Hx    Allergies  Allergen Reactions  . Amoxicillin Hives    Has patient had a PCN reaction causing immediate  rash, facial/tongue/throat swelling, SOB or lightheadedness with hypotension: No Has patient had a PCN reaction causing severe rash involving mucus membranes or skin necrosis: Yes Has patient had a PCN reaction that required hospitalization No Has patient had a PCN reaction occurring within the last 10 years: No If all of the above answers are "NO", then may proceed with Cephalosporin use.   Marland Kitchen Penicillins   . Phenergan [Promethazine Hcl] Nausea And Vomiting  . Clarithromycin Nausea And Vomiting and Rash  . Latex Itching and Rash    burns  . Sulfa Antibiotics Itching, Nausea And Vomiting and Rash    Other reaction(s): Unknown  . Transderm-Scop [Scopolamine] Rash    n/v    Current Outpatient Medications:  .  Albuterol Sulfate (PROAIR RESPICLICK) 108 (90 Base) MCG/ACT AEPB, Inhale into the lungs., Disp: , Rfl:  .  cyanocobalamin 1000 MCG tablet, Take 1,000 mcg by mouth daily., Disp: , Rfl:  .  fluticasone (FLONASE) 50 MCG/ACT nasal spray, Place 2 sprays into both nostrils daily., Disp: , Rfl:  .  ibuprofen (ADVIL,MOTRIN) 800 MG tablet, Take 1 tablet (800 mg total) by mouth 3 (three) times daily., Disp: 30 tablet, Rfl: 1  Review of Systems  Constitutional: Negative.   Gastrointestinal: Positive for abdominal pain, diarrhea and nausea.  Negative for abdominal distention, anal bleeding, blood in stool, constipation, rectal pain and vomiting.  Genitourinary: Negative for dysuria.  Musculoskeletal: Positive for arthralgias, back pain, myalgias and neck stiffness. Negative for gait problem, joint swelling and neck pain.  Neurological: Positive for tingling, weakness, numbness and headaches. Negative for dizziness and light-headedness.   Social History   Tobacco Use  . Smoking status: Former Smoker    Last attempt to quit: 08/02/1986    Years since quitting: 31.5  . Smokeless tobacco: Never Used  . Tobacco comment: QUIT IN 1992  Substance Use Topics  . Alcohol use: No    Alcohol/week: 0.0  standard drinks   Objective:   BP 100/64 (BP Location: Right Arm, Patient Position: Sitting, Cuff Size: Normal)   Pulse 83   Temp 98.6 F (37 C) (Oral)   Wt 156 lb (70.8 kg)   SpO2 96%   BMI 24.43 kg/m  Vitals:   02/18/18 1400  BP: 100/64  Pulse: 83  Temp: 98.6 F (37 C)  TempSrc: Oral  SpO2: 96%  Weight: 156 lb (70.8 kg)    Physical Exam  Constitutional: She is oriented to person, place, and time. She appears well-developed and well-nourished. No distress.  HENT:  Head: Normocephalic and atraumatic.  Right Ear: Hearing normal.  Left Ear: Hearing normal.  Nose: Nose normal.  Eyes: Conjunctivae and lids are normal. Right eye exhibits no discharge. Left eye exhibits no discharge. No scleral icterus.  Neck: Neck supple.  Soreness posterior base of neck. Full ROM.  Cardiovascular: Normal rate and regular rhythm.  Pulmonary/Chest: Effort normal. No respiratory distress.  Abdominal: Soft. Bowel sounds are normal. There is tenderness.  Suprapubic region to palpation. No masses.  Musculoskeletal: She exhibits tenderness.  Tender in lumbar to sacrum musculature. SLR's to 90 degrees with pain. Good strength bilaterally. Pulses 2+ and symmetric. DTR's symmetric. Increased pain to test flexion or extension of spine with radiation down both legs to thighs posteriorly..  Neurological: She is alert and oriented to person, place, and time. She displays normal reflexes.  Skin: Skin is intact. No lesion and no rash noted.  Psychiatric: She has a normal mood and affect. Her speech is normal and behavior is normal. Thought content normal.      Assessment & Plan:     1. Acute bilateral low back pain with bilateral sciatica Onset when she stepped into a hole while pulling a heavy stack of wood she was dragging to the street from a downed tree in her yard on 02-14-18. Developed pressure and burning in buttocks to the posterior thighs and occasional radiation to feet (L>R) on 02-15-18. Tried  Salonpas Lidocaine, Doan's Pills and ice packs with little relief. Heating pad some help. Suspect severe strain and spasms in lower back. Will treat with Ibuprofen 800 mg TID and Robaxin 750 mg QID prn. Home to rest and recheck if no better in 5-7 days. - methocarbamol (ROBAXIN) 750 MG tablet; Take 1 tablet (750 mg total) by mouth 4 (four) times daily.  Dispense: 30 tablet; Refill: 1 - ibuprofen (ADVIL,MOTRIN) 800 MG tablet; Take 1 tablet (800 mg total) by mouth 3 (three) times daily.  Dispense: 30 tablet; Refill: 1  2. Urinary frequency Some increase in urinary frequency but no dysuria. No fever or hematuria. Urinalysis shows high specific gravity and positive for proteins. No sign of infection. Recommend increased water intake and recheck prn. - POCT urinalysis dipstick  3. Needs flu shot - Flu Vaccine QUAD 6+ mos PF  IM (Fluarix Quad PF)       Dortha Kern, PA  [facility]

## 2018-02-18 NOTE — Patient Instructions (Signed)
Acute Pain, Adult  Acute pain is a type of pain that may last for just a few days or as long as six months. It is often related to an illness, injury, or medical procedure. Acute pain may be mild, moderate, or severe. It usually goes away once your injury has healed or you are no longer ill.  Pain can make it hard for you to do daily activities. It can cause anxiety and lead to other problems if left untreated. Treatment depends on the cause and severity of your acute pain.  Follow these instructions at home:   Check your pain level as told by your health care provider.   Take over-the-counter and prescription medicines only as told by your health care provider.   If you are taking prescription pain medicine:  ? Ask your health care provider about taking a stool softener or laxative to prevent constipation.  ? Do not stop taking the medicine suddenly. Talk to your health care provider about how and when to discontinue prescription pain medicine.  ? If your pain is severe, do not take more pills than instructed by your health care provider.  ? Do not take other over-the-counter pain medicines in addition to this medicine unless told by your health care provider.  ? Do not drive or operate heavy machinery while taking prescription pain medicine.   Apply ice or heat as told by your health care provider. These may reduce swelling and pain.   Ask your health care provider if other strategies such as distraction, relaxation, or physical therapies can help your pain.   Keep all follow-up visits as told by your health care provider. This is important.  Contact a health care provider if:   You have pain that is not controlled by medicine.   Your pain does not improve or gets worse.   You have side effects from pain medicines, such as vomitingor confusion.  Get help right away if:   You have severe pain.   You have trouble breathing.   You lose consciousness.   You have chest pain or pressure that lasts for more  than a few minutes. Along with the chest pain you may:  ? Have pain or discomfort in one or both arms, your back, neck, jaw, or stomach.  ? Have shortness of breath.  ? Break out in a cold sweat.  ? Feel nauseous.  ? Become light-headed.  These symptoms may represent a serious problem that is an emergency. Do not wait to see if the symptoms will go away. Get medical help right away. Call your local emergency services (911 in the [place] Do not drive yourself to the hospital.  This information is not intended to replace advice given to you by your health care provider. Make sure you discuss any questions you have with your health care provider.  Document Released: 06/12/2015 Document Revised: 11/04/2015 Document Reviewed: 06/12/2015  Elsevier Interactive Patient Education  2018 [employer] Inc[employer]

## 2018-02-19 LAB — POCT URINALYSIS DIPSTICK
Bilirubin, UA: NEGATIVE
Blood, UA: NEGATIVE
Glucose, UA: NEGATIVE
KETONES UA: NEGATIVE
LEUKOCYTES UA: NEGATIVE
NITRITE UA: NEGATIVE
PROTEIN UA: POSITIVE — AB
Spec Grav, UA: >=1.03 — AB (ref 1.010–1.025)
Urobilinogen, UA: 0.2 E.U./dL
pH, UA: 5 (ref 5.0–8.0)

## 2018-02-25 ENCOUNTER — Encounter: Payer: Self-pay | Admitting: Family Medicine

## 2018-02-25 ENCOUNTER — Ambulatory Visit (INDEPENDENT_AMBULATORY_CARE_PROVIDER_SITE_OTHER): Payer: Managed Care, Other (non HMO) | Admitting: Family Medicine

## 2018-02-25 VITALS — BP 100/70 | HR 73 | Temp 97.9°F | Resp 15 | Wt 157.6 lb

## 2018-02-25 DIAGNOSIS — M5440 Lumbago with sciatica, unspecified side: Secondary | ICD-10-CM | POA: Diagnosis not present

## 2018-02-25 DIAGNOSIS — R350 Frequency of micturition: Secondary | ICD-10-CM

## 2018-02-25 DIAGNOSIS — M545 Low back pain, unspecified: Secondary | ICD-10-CM

## 2018-02-25 NOTE — Progress Notes (Signed)
Patient: Kristen Meyer Female    DOB: 14-Oct-1970   47 y.o.   MRN: [id number] Visit Date: 02/25/2018  Today's Provider: Dortha Kern, PA   Chief Complaint  Patient presents with  . Back Pain    1 week follow up   Subjective:    Back Pain  The current episode started in the past 7 days. The problem is unchanged. The pain is present in the lumbar spine. The quality of the pain is described as burning, shooting and stabbing. Radiates to: both sides of legs down into feet. The pain is moderate. The pain is the same all the time. The symptoms are aggravated by lying down, sitting and bending. Associated symptoms include bladder incontinence, bowel incontinence, numbness, tingling and weakness. Pertinent negatives include no abdominal pain, chest pain, dysuria, fever, headaches, leg pain, paresis, paresthesias, pelvic pain, perianal numbness or weight loss. Risk factors include recent trauma. She has tried NSAIDs and heat (Robaxin 750mg ) for the symptoms. The treatment provided mild relief.      Past Medical History:  Diagnosis Date  . Abnormal uterine bleeding   . Complication of anesthesia   . Ovarian cyst    left  . PONV (postoperative nausea and vomiting)   . Thyroid nodule    Patient Active Problem List   Diagnosis Date Noted  . Pelvic pain 07/12/2016  . Hematuria 07/12/2016  . Status post endometrial ablation 07/12/2016  . Status post tubal ligation 07/12/2016  . Allergic rhinitis 11/23/2015  . History of changes in urinary output 11/23/2015  . Mild reactive airways disease 11/23/2015  . CN (constipation) 02/24/2015  . Acid indigestion 02/24/2015  . LBP (low back pain) 02/24/2015  . Galactorrhea not associated with childbirth 02/22/2009  . Enthesopathy 10/28/2008  . Anemia, iron deficiency 03/11/2007   Past Surgical History:  Procedure Laterality Date  . ABLATION     uterine  . BREAST ENHANCEMENT SURGERY    . BUNIONECTOMY Right 2008  . DILATION AND  CURETTAGE OF UTERUS    . HYSTEROSCOPY    . LAPAROSCOPY N/A 08/06/2016   Procedure: LAPAROSCOPY DIAGNOSTIC;  Surgeon: Herold Harms, MD;  Location: [facility] ORS;  Service: Gynecology;  Laterality: N/A;  . SALPINGECTOMY Right    partial  . TONSILLECTOMY AND ADENOIDECTOMY  1988  . TUBAL LIGATION  1994   Family History  Problem Relation Age of Onset  . COPD Mother   . Healthy Father   . Healthy Sister   . Healthy Brother   . Breast cancer Neg Hx   . Ovarian cancer Neg Hx   . Colon cancer Neg Hx   . Diabetes Neg Hx   . Heart disease Neg Hx    Allergies  Allergen Reactions  . Amoxicillin Hives    Has patient had a PCN reaction causing immediate rash, facial/tongue/throat swelling, SOB or lightheadedness with hypotension: No Has patient had a PCN reaction causing severe rash involving mucus membranes or skin necrosis: Yes Has patient had a PCN reaction that required hospitalization No Has patient had a PCN reaction occurring within the last 10 years: No If all of the above answers are "NO", then may proceed with Cephalosporin use.   Marland Kitchen Penicillins   . Phenergan [Promethazine Hcl] Nausea And Vomiting  . Clarithromycin Nausea And Vomiting and Rash  . Latex Itching and Rash    burns  . Sulfa Antibiotics Itching, Nausea And Vomiting and Rash    Other reaction(s): Unknown  . Transderm-Scop [  Scopolamine] Rash    n/v    Current Outpatient Medications:  .  Albuterol Sulfate (PROAIR RESPICLICK) 108 (90 Base) MCG/ACT AEPB, Inhale into the lungs., Disp: , Rfl:  .  cyanocobalamin 1000 MCG tablet, Take 1,000 mcg by mouth daily., Disp: , Rfl:  .  fluticasone (FLONASE) 50 MCG/ACT nasal spray, Place 2 sprays into both nostrils daily., Disp: , Rfl:  .  ibuprofen (ADVIL,MOTRIN) 800 MG tablet, Take 1 tablet (800 mg total) by mouth 3 (three) times daily., Disp: 30 tablet, Rfl: 1 .  methocarbamol (ROBAXIN) 750 MG tablet, Take 1 tablet (750 mg total) by mouth 4 (four) times daily., Disp: 30 tablet,  Rfl: 1  Review of Systems  Constitutional: Negative for fever and weight loss.  Cardiovascular: Negative for chest pain.  Gastrointestinal: Positive for bowel incontinence. Negative for abdominal pain.  Genitourinary: Positive for bladder incontinence. Negative for dysuria and pelvic pain.  Musculoskeletal: Positive for back pain.  Neurological: Positive for tingling, weakness and numbness. Negative for headaches and paresthesias.   Social History   Tobacco Use  . Smoking status: Former Smoker    Last attempt to quit: 08/02/1986    Years since quitting: 31.5  . Smokeless tobacco: Never Used  . Tobacco comment: QUIT IN 1992  Substance Use Topics  . Alcohol use: No    Alcohol/week: 0.0 standard drinks   Objective:   BP 100/70   Pulse 73   Temp 97.9 F (36.6 C) (Oral)   Resp 15   Wt 157 lb 9.6 oz (71.5 kg)   SpO2 98%   BMI 24.68 kg/m  Vitals:   02/25/18 1517  BP: 100/70  Pulse: 73  Resp: 15  Temp: 97.9 F (36.6 C)  TempSrc: Oral  SpO2: 98%  Weight: 157 lb 9.6 oz (71.5 kg)   Physical Exam  Constitutional: She is oriented to person, place, and time. She appears well-developed and well-nourished. No distress.  HENT:  Head: Normocephalic and atraumatic.  Right Ear: Hearing normal.  Left Ear: Hearing normal.  Nose: Nose normal.  Eyes: Conjunctivae and lids are normal. Right eye exhibits no discharge. Left eye exhibits no discharge. No scleral icterus.  Neck: Neck supple.  Cardiovascular: Normal rate and regular rhythm.  Pulmonary/Chest: Effort normal and breath sounds normal. No respiratory distress.  Abdominal: Soft. Bowel sounds are normal.  Musculoskeletal: She exhibits tenderness.  Pain in lower lumbar and sacral area with radiation to the posterior thighs with burning. Some prickly numbness in bottom of feet and toes intermittently. Pressure and shooting pelvic pains with pins and needles sensation in saddle/genital area. DTR's 1+ both knees. Unable to elicit ankle  jerks. Extension or flexion causes burning in lower back/sacrum area.  Neurological: She is alert and oriented to person, place, and time. A sensory deficit is present.  Skin: Skin is intact. No lesion and no rash noted.  Psychiatric: She has a normal mood and affect. Her speech is normal and behavior is normal. Thought content normal.      Assessment & Plan:     1. Low back pain with radiation Persistent low back pain and burning that radiates from sacrum to posterior thighs despite treatment with Ibuprofen 800 mg TID and Robaxin 750 mg up to one QID after an injury while clearing a downed tree from her new yard (stepped in a hole jarring "entire" spine). Heat applications some help but having more burning and tingling numbness in the saddle region and pins & needles numbness in bottom of feet  and toes intermittently. Will frequent BM and some urinary frequency with occasional incontinence, need MRI L-S Spine to rule out cauda equina. Will get CMP to check renal function in case contrast needed. [facility]  - Comprehensive metabolic panel - MR Lumbar Spine Wo Contrast  2. Urinary frequency Frequent urination and occasional incontinence with 3 BM's of soft stool between 8am-12pm each day. This is entirely abnormal for her - normally has problems with constipation with no BM for several days. Near stool incontinence at times. Will get MRI to rule out cauda equina. - Comprehensive metabolic panel - MR Lumbar Spine Wo Contrast       Dortha Kernennis Azaya Goedde, PA  [facility]

## 2018-02-26 LAB — COMPREHENSIVE METABOLIC PANEL
ALK PHOS: 66 IU/L (ref 39–117)
ALT: 18 IU/L (ref 0–32)
AST: 21 IU/L (ref 0–40)
Albumin/Globulin Ratio: 1.8 (ref 1.2–2.2)
Albumin: 4.2 g/dL (ref 3.5–5.5)
BUN/Creatinine Ratio: 9 (ref 9–23)
BUN: 9 mg/dL (ref 6–24)
Bilirubin Total: 0.2 mg/dL (ref 0.0–1.2)
CO2: 26 mmol/L (ref 20–29)
CREATININE: 1.04 mg/dL — AB (ref 0.57–1.00)
Calcium: 9.3 mg/dL (ref 8.7–10.2)
Chloride: 105 mmol/L (ref 96–106)
GFR calc Af Amer: 74 mL/min/{1.73_m2} (ref >59)
GFR calc non Af Amer: 65 mL/min/{1.73_m2} (ref >59)
GLOBULIN, TOTAL: 2.3 g/dL (ref 1.5–4.5)
Glucose: 88 mg/dL (ref 65–99)
POTASSIUM: 3.9 mmol/L (ref 3.5–5.2)
Sodium: 144 mmol/L (ref 134–144)
Total Protein: 6.5 g/dL (ref 6.0–8.5)

## 2018-02-27 ENCOUNTER — Telehealth: Payer: Self-pay

## 2018-02-27 ENCOUNTER — Telehealth: Payer: Self-pay | Admitting: Family Medicine

## 2018-02-27 NOTE — Telephone Encounter (Signed)
Pt advised.   Thanks,   -Jaymarie Yeakel  

## 2018-02-27 NOTE — Telephone Encounter (Signed)
Pt called requesting something to help her relax be called into [facility] in order to get MRI done tomorrow

## 2018-02-27 NOTE — Telephone Encounter (Signed)
-----   Message from Tamsen Roersennis E Chrismon, [place] sent at 02/27/2018  1:50 PM EDT ----- Labs essentially normal. Proceed with orthopedic spine specialist and MRI as scheduled.

## 2018-02-28 ENCOUNTER — Ambulatory Visit
Admission: RE | Admit: 2018-02-28 | Discharge: 2018-02-28 | Disposition: A | Discharge status: Home / Self Care | Payer: Managed Care, Other (non HMO) | Source: Ambulatory Visit | Attending: Family Medicine | Admitting: Family Medicine

## 2018-02-28 ENCOUNTER — Telehealth: Payer: Self-pay | Admitting: Family Medicine

## 2018-02-28 ENCOUNTER — Other Ambulatory Visit: Payer: Self-pay | Admitting: Family Medicine

## 2018-02-28 DIAGNOSIS — M5136 Other intervertebral disc degeneration, lumbar region: Secondary | ICD-10-CM | POA: Insufficient documentation

## 2018-02-28 DIAGNOSIS — M5440 Lumbago with sciatica, unspecified side: Secondary | ICD-10-CM | POA: Insufficient documentation

## 2018-02-28 DIAGNOSIS — R350 Frequency of micturition: Secondary | ICD-10-CM | POA: Insufficient documentation

## 2018-02-28 MED ORDER — LORAZEPAM 0.5 MG PO TABS: ORAL_TABLET | ORAL | 0 refills | Status: AC

## 2018-02-28 MED ORDER — ONDANSETRON HCL 4 MG PO TABS: ORAL_TABLET | ORAL | 0 refills | Status: AC

## 2018-02-28 NOTE — Telephone Encounter (Signed)
Done

## 2018-02-28 NOTE — Telephone Encounter (Signed)
Pt was calling back regarding the medication to relax her during her MRI scheduled for today at 7:30 pm.  She is also requesting Zofran to take along with the other medication to help her to not have the vomiting.  Please call pt back to let her know when this is called in.  Thanks, [employer]

## 2018-02-28 NOTE — Telephone Encounter (Signed)
Shouldn't need this with the use of the Lorazepam. Will leave an order at the pharmacy for a little, in case it is appears.

## 2018-02-28 NOTE — Telephone Encounter (Signed)
Left message advising pt.  (Per DPR)  Thanks,   -Laura  

## 2018-03-03 ENCOUNTER — Telehealth: Payer: Self-pay | Admitting: Family Medicine

## 2018-03-03 DIAGNOSIS — M5441 Lumbago with sciatica, right side: Secondary | ICD-10-CM

## 2018-03-03 DIAGNOSIS — M5442 Lumbago with sciatica, left side: Principal | ICD-10-CM

## 2018-03-03 MED ORDER — PREDNISONE 5 MG PO TABS: ORAL_TABLET | ORAL | 0 refills | Status: DC

## 2018-03-03 NOTE — Telephone Encounter (Signed)
-----   Message from Tamsen Roersennis E Chrismon, [place] sent at 03/02/2018  5:22 PM EDT ----- No sign of disc herniation. Proceed with referral to orthopedic spine specialist.

## 2018-03-03 NOTE — Telephone Encounter (Signed)
lmtcb

## 2018-03-03 NOTE — Telephone Encounter (Signed)
Will send prednisone taper to the [facility]. Don't take the Ibuprofen with the Prednisone due to possible stomach irritation. Give the Salonpas Lidocaine patches a try to help with discomfort.

## 2018-03-03 NOTE — Telephone Encounter (Signed)
Patient advised as below. Patient agreed to trying Prednisone taper. Patient also requesting refill on Ibuprofen. Patient reports that the Robaxin make her sleep so she can not take during work hours.

## 2018-03-03 NOTE — Telephone Encounter (Signed)
Patient advised as below. Patient reports she does have an appt in oct at [facility]. Patient reports she is still having a lot of lower back pain and reports that its been going on for over two weeks. I advised patient to call ortho and see if they can get her in sooner.

## 2018-03-03 NOTE — Telephone Encounter (Signed)
If can't get in sooner, consider trying a 10-12 day Prednisone taper for inflammation and continue Robaxin 750 mg TID with Salonpas Lidocaine patches.

## 2018-03-03 NOTE — Telephone Encounter (Signed)
-----   Message from Dennis E Chrismon, PA sent at 03/02/2018  5:22 PM EDT ----- No sign of disc herniation. Proceed with referral to orthopedic spine specialist. 

## 2018-03-03 NOTE — Telephone Encounter (Signed)
Pt calling regarding MRI results from Fri.  Pt is still in severe pain and needing to speak with a nurse to know what is next for getting help.  Please advise.  Thanks, [employer]

## 2018-03-03 NOTE — Telephone Encounter (Signed)
Left voice mail, regarding walk in clinic at [facility].

## 2018-03-12 ENCOUNTER — Ambulatory Visit: Payer: Managed Care, Other (non HMO)

## 2018-03-27 ENCOUNTER — Encounter: Payer: Managed Care, Other (non HMO) | Admitting: Family Medicine

## 2018-04-08 ENCOUNTER — Encounter: Payer: Managed Care, Other (non HMO) | Admitting: Family Medicine

## 2018-05-13 NOTE — Progress Notes (Signed)
Patient: Kristen Meyer, Female    DOB: Apr 29, 1971, 47 y.o.   MRN: [id number] Visit Date: 05/15/2018  Today's Provider: Dortha Kern, PA   Chief Complaint  Patient presents with  . Annual Exam   Subjective:    Annual physical exam Kristen Meyer is a 47 y.o. female who presents today for health maintenance and complete physical. She feels well. She reports exercising 203 times weekly. She reports she is sleeping well.   Immunization History  Administered Date(s) Administered  . Hepatitis A 03/13/2010, 04/10/2010  . Hepatitis B 03/13/2010, 04/10/2010  . Influenza,inj,Quad PF,6+ Mos 02/18/2018  . Influenza-Unspecified 04/02/2017  . Td 09/02/1989  . Tdap 03/13/2010, 02/08/2017   06/22/15 Colonoscopy 02/08/17 Pap-normal Mammogram-no report from 01/2017 order -----------------------------------------------------------------   Lipid/Cholesterol, Follow-up:   Last seen for this 1 years ago.  Management since that visit includes; labs ordered, but zero were done.  Last Lipid Panel:    Component Value Date/Time   CHOL 232 (H) 02/08/2017 1103   TRIG 149 02/08/2017 1103   HDL 40 02/08/2017 1103   CHOLHDL 5.8 (H) 02/08/2017 1103   LDLCALC 162 (H) 02/08/2017 1103   She is not currently on medication, she is working on habits.   Wt Readings from Last 3 Encounters:  05/15/18 161 lb (73 kg)  02/25/18 157 lb 9.6 oz (71.5 kg)  02/18/18 156 lb (70.8 kg)    ------------------------------------------------------------------------  Thyroid nodule From 05/14/2017-FNA of right lobe nodule on 09-27-16 by Dr. Willeen Cass (ENT) reported as benign. Labs ordered, but zero were done.    Review of Systems  Constitutional: Negative.   HENT: Positive for congestion, sinus pain and sneezing.   Eyes: Negative.   Respiratory: Negative.   Cardiovascular: Negative.   Gastrointestinal: Negative.   Endocrine: Negative.   Genitourinary: Negative.   Musculoskeletal:  Negative.   Skin: Negative.   Allergic/Immunologic: Negative.   Neurological: Negative.   Hematological: Negative.   Psychiatric/Behavioral: Negative.     Social History      She  reports that she quit smoking about 31 years ago. She has never used smokeless tobacco. She reports that she does not drink alcohol or use drugs.       Social History   Socioeconomic History  . Marital status: Divorced    Spouse name: Not on file  . Number of children: Not on file  . Years of education: Not on file  . Highest education level: Not on file  Occupational History  . Not on file  Social Needs  . Financial resource strain: Not on file  . Food insecurity:    Worry: Not on file    Inability: Not on file  . Transportation needs:    Medical: Not on file    Non-medical: Not on file  Tobacco Use  . Smoking status: Former Smoker    Last attempt to quit: 08/02/1986    Years since quitting: 31.8  . Smokeless tobacco: Never Used  . Tobacco comment: QUIT IN 1992  Substance and Sexual Activity  . Alcohol use: No    Alcohol/week: 0.0 standard drinks  . Drug use: No  . Sexual activity: Yes    Birth control/protection: Surgical  Lifestyle  . Physical activity:    Days per week: Not on file    Minutes per session: Not on file  . Stress: Not on file  Relationships  . Social connections:    Talks on phone: Not on file  Gets together: Not on file    Attends religious service: Not on file    Active member of club or organization: Not on file    Attends meetings of clubs or organizations: Not on file    Relationship status: Not on file  Other Topics Concern  . Not on file  Social History Narrative  . Not on file    Past Medical History:  Diagnosis Date  . Abnormal uterine bleeding   . Complication of anesthesia   . Ovarian cyst    left  . PONV (postoperative nausea and vomiting)   . Thyroid nodule    Patient Active Problem List   Diagnosis Date Noted  . Pelvic pain 07/12/2016    . Hematuria 07/12/2016  . Status post endometrial ablation 07/12/2016  . Status post tubal ligation 07/12/2016  . Allergic rhinitis 11/23/2015  . History of changes in urinary output 11/23/2015  . Mild reactive airways disease 11/23/2015  . CN (constipation) 02/24/2015  . Acid indigestion 02/24/2015  . LBP (low back pain) 02/24/2015  . Galactorrhea not associated with childbirth 02/22/2009  . Enthesopathy 10/28/2008  . Anemia, iron deficiency 03/11/2007    Past Surgical History:  Procedure Laterality Date  . ABLATION     uterine  . BREAST ENHANCEMENT SURGERY    . BUNIONECTOMY Right 2008  . DILATION AND CURETTAGE OF UTERUS    . HYSTEROSCOPY    . LAPAROSCOPY N/A 08/06/2016   Procedure: LAPAROSCOPY DIAGNOSTIC;  Surgeon: Herold Harms, MD;  Location: [facility] ORS;  Service: Gynecology;  Laterality: N/A;  . SALPINGECTOMY Right    partial  . TONSILLECTOMY AND ADENOIDECTOMY  1988  . TUBAL LIGATION  1994   Family History        Family Status  Relation Name Status  . Mother  Alive  . Father  Alive  . Sister  Alive  . Brother  Alive  . Neg Hx  (Not Specified)        Her family history includes COPD in her mother; Healthy in her brother, father, and sister. There is no history of Breast cancer, Ovarian cancer, Colon cancer, Diabetes, or Heart disease.      Allergies  Allergen Reactions  . Amoxicillin Hives    Has patient had a PCN reaction causing immediate rash, facial/tongue/throat swelling, SOB or lightheadedness with hypotension: No Has patient had a PCN reaction causing severe rash involving mucus membranes or skin necrosis: Yes Has patient had a PCN reaction that required hospitalization No Has patient had a PCN reaction occurring within the last 10 years: No If all of the above answers are "NO", then may proceed with Cephalosporin use.   Marland Kitchen Penicillins   . Phenergan [Promethazine Hcl] Nausea And Vomiting  . Clarithromycin Nausea And Vomiting and Rash  . Latex  Itching and Rash    burns  . Sulfa Antibiotics Itching, Nausea And Vomiting and Rash    Other reaction(s): Unknown  . Transderm-Scop [Scopolamine] Rash    n/v    Current Outpatient Medications:  .  Albuterol Sulfate (PROAIR RESPICLICK) 108 (90 Base) MCG/ACT AEPB, Inhale into the lungs., Disp: , Rfl:  .  cyanocobalamin 1000 MCG tablet, Take 1,000 mcg by mouth daily., Disp: , Rfl:  .  fluticasone (FLONASE) 50 MCG/ACT nasal spray, Place 2 sprays into both nostrils daily., Disp: , Rfl:  .  ibuprofen (ADVIL,MOTRIN) 800 MG tablet, Take 1 tablet (800 mg total) by mouth 3 (three) times daily., Disp: 30 tablet, Rfl: 1 .  LORazepam (ATIVAN) 0.5 MG tablet, One tablet by mouth 2-3 hours prior to procedure - May repeat at appointment if needed., Disp: 5 tablet, Rfl: 0 .  ondansetron (ZOFRAN) 4 MG tablet, Take one tablet by mouth 1 hour prior to procedure if nausea develops. May repeat in 3-4 hours if needed., Disp: 2 tablet, Rfl: 0   Patient Care Team: , Jodell Ciproennis E, PA as PCP - General (Physician Assistant)     Objective:   Vitals: BP 116/72 (BP Location: Right Arm, Patient Position: Sitting, Cuff Size: Normal)   Pulse 76   Temp 97.6 F (36.4 C) (Oral)   Resp 16   Ht 5' 6.5" (1.689 m)   Wt 161 lb (73 kg)   SpO2 99%   BMI 25.60 kg/m    Vitals:   05/15/18 1341  BP: 116/72  Pulse: 76  Resp: 16  Temp: 97.6 F (36.4 C)  TempSrc: Oral  SpO2: 99%  Weight: 161 lb (73 kg)  Height: 5' 6.5" (1.689 m)    Physical Exam  Constitutional: She is oriented to person, place, and time. She appears well-developed and well-nourished.  HENT:  Head: Normocephalic and atraumatic.  Right Ear: External ear normal.  Left Ear: External ear normal.  Nose: Nose normal.  Mouth/Throat: Oropharynx is clear and moist.  Eyes: Pupils are equal, round, and reactive to light. Conjunctivae and EOM are normal. Right eye exhibits no discharge.  Neck: Normal range of motion. Neck supple. No tracheal deviation  present. No thyromegaly present.  Cardiovascular: Normal rate, regular rhythm, normal heart sounds and intact distal pulses.  No murmur heard. Pulmonary/Chest: Effort normal and breath sounds normal. No respiratory distress. She has no wheezes. She has no rales. She exhibits no tenderness.  Abdominal: Soft. Bowel sounds are normal. She exhibits no distension and no mass. There is no tenderness. There is no rebound and no guarding.  Musculoskeletal: Normal range of motion. She exhibits no edema or tenderness.  Lymphadenopathy:    She has no cervical adenopathy.  Neurological: She is alert and oriented to person, place, and time. She has normal reflexes. She displays normal reflexes. No cranial nerve deficit. She exhibits normal muscle tone. Coordination normal.  Skin: Skin is warm and dry. No rash noted. No erythema.  Psychiatric: She has a normal mood and affect. Her behavior is normal. Judgment and thought content normal.   Fall Risk  05/15/2018 02/08/2017 11/23/2015  Falls in the past year? 0 No No   Depression Screen PHQ 2/9 Scores 05/15/2018 02/08/2017 11/23/2015  PHQ - 2 Score 0 0 0   Functional Status Survey: Is the patient deaf or have difficulty hearing?: No Does the patient have difficulty seeing, even when wearing glasses/contacts?: No Does the patient have difficulty concentrating, remembering, or making decisions?: No Does the patient have difficulty walking or climbing stairs?: No Does the patient have difficulty dressing or bathing?: No Does the patient have difficulty doing errands alone such as visiting a doctor's office or shopping?: No    Office Visit from 05/15/2018 in [facility]  AUDIT-C Score  1     Home Exercise  05/15/2018  Current Exercise Habits Home exercise routine  Frequency (Times/Week) 3    Assessment & Plan:     Routine Health Maintenance and Physical Exam  Exercise Activities and Dietary recommendations Goals   Gym workouts 2-3 times  a week.     Immunization History  Administered Date(s) Administered  . Hepatitis A 03/13/2010, 04/10/2010  . Hepatitis B  03/13/2010, 04/10/2010  . Influenza,inj,Quad PF,6+ Mos 02/18/2018  . Influenza-Unspecified 04/02/2017  . Td 09/02/1989  . Tdap 03/13/2010, 02/08/2017    Health Maintenance  Topic Date Due  . PAP SMEAR  02/09/2020  . TETANUS/TDAP  02/09/2027  . INFLUENZA VACCINE  Completed  . HIV Screening  Completed     Discussed health benefits of physical activity, and encouraged her to engage in regular exercise appropriate for her age and condition.    -------------------------------------------------------------------- 1. Annual physical exam General health good. No menses since uterine ablation therapy in 2016 by Dr. Tiburcio Pea. Still has some abdominal tenderness in the pelvic region (R&L) intermittently, but tolerable. Had Laparoscopic evaluation by Dr.Defrancesco 08-06-16 without pathology being found. Recommend she continue follow up with GYN for PAP and breast exam. Immunizations up to date. Recheck routine labs and follow up pending reports. - CBC with Differential/Platelet - Comprehensive metabolic panel - Lipid Panel With LDL/HDL Ratio - TSH  2. Allergic rhinitis due to pollen, unspecified seasonality No significant congestion or rhinorrhea recently. Requests refill of Flonase to prevent recurrence of allergic rhinitis. - CBC with Differential/Platelet - fluticasone (FLONASE) 50 MCG/ACT nasal spray; Place 2 sprays into both nostrils daily.  Dispense: 18.2 g; Refill: 3  3. Mild intermittent reactive airway disease without complication Stable with intermittent need for the Albuterol. Requests refill for prn usage. No cough or congestion recently. Will check CBC with diff and follow up as needed. - CBC with Differential/Platelet - albuterol (PROVENTIL HFA;VENTOLIN HFA) 108 (90 Base) MCG/ACT inhaler; Inhale 2 puffs into the lungs every 6 (six) hours as needed for  wheezing or shortness of breath.  Dispense: 1 Inhaler; Refill: 2  4. Status post endometrial ablation Had partial right salpingectomy with endometrial resection with pelvic pain and menorrhagia after ablation in 2015. No menses in the past 3 years.  5. Status post tubal ligation BTL in 1994 and no menses since ablation therapy in 2016 by Dr. Tiburcio Pea.    Dortha Kern, PA  [facility]

## 2018-05-15 ENCOUNTER — Ambulatory Visit (INDEPENDENT_AMBULATORY_CARE_PROVIDER_SITE_OTHER): Payer: Managed Care, Other (non HMO) | Admitting: Family Medicine

## 2018-05-15 VITALS — BP 116/72 | HR 76 | Temp 97.6°F | Resp 16 | Ht 66.5 in | Wt 161.0 lb

## 2018-05-15 DIAGNOSIS — Z9889 Other specified postprocedural states: Secondary | ICD-10-CM | POA: Diagnosis not present

## 2018-05-15 DIAGNOSIS — Z0000 Encounter for general adult medical examination without abnormal findings: Secondary | ICD-10-CM | POA: Diagnosis not present

## 2018-05-15 DIAGNOSIS — J4520 Mild intermittent asthma, uncomplicated: Secondary | ICD-10-CM | POA: Diagnosis not present

## 2018-05-15 DIAGNOSIS — J301 Allergic rhinitis due to pollen: Secondary | ICD-10-CM

## 2018-05-15 DIAGNOSIS — Z9851 Tubal ligation status: Secondary | ICD-10-CM

## 2018-05-15 MED ORDER — ALBUTEROL SULFATE HFA 108 (90 BASE) MCG/ACT IN AERS: 2.0000 | INHALATION_SPRAY | Freq: Four times a day (QID) | RESPIRATORY_TRACT | 2 refills | Status: AC | PRN

## 2018-05-15 MED ORDER — FLUTICASONE PROPIONATE 50 MCG/ACT NA SUSP: 2.0000 | Freq: Every day | NASAL | 3 refills | Status: AC

## 2018-05-17 ENCOUNTER — Encounter: Payer: Self-pay | Admitting: Family Medicine

## 2018-05-21 LAB — TSH: TSH: 2.69 u[IU]/mL (ref 0.450–4.500)

## 2018-05-21 LAB — CBC WITH DIFFERENTIAL/PLATELET
BASOS ABS: 0.1 10*3/uL (ref 0.0–0.2)
BASOS: 2 %
EOS (ABSOLUTE): 0.2 10*3/uL (ref 0.0–0.4)
Eos: 4 %
Hematocrit: 37.7 % (ref 34.0–46.6)
Hemoglobin: 12.7 g/dL (ref 11.1–15.9)
IMMATURE GRANULOCYTES: 0 %
Immature Grans (Abs): 0 10*3/uL (ref 0.0–0.1)
LYMPHS: 29 %
Lymphocytes Absolute: 1.4 10*3/uL (ref 0.7–3.1)
MCH: 30.8 pg (ref 26.6–33.0)
MCHC: 33.7 g/dL (ref 31.5–35.7)
MCV: 91 fL (ref 79–97)
MONOCYTES: 9 %
MONOS ABS: 0.5 10*3/uL (ref 0.1–0.9)
NEUTROS ABS: 2.8 10*3/uL (ref 1.4–7.0)
Neutrophils: 56 %
Platelets: 370 10*3/uL (ref 150–450)
RBC: 4.13 x10E6/uL (ref 3.77–5.28)
RDW: 11.9 % — ABNORMAL LOW (ref 12.3–15.4)
WBC: 5 10*3/uL (ref 3.4–10.8)

## 2018-05-21 LAB — COMPREHENSIVE METABOLIC PANEL
ALBUMIN: 4 g/dL (ref 3.5–5.5)
ALK PHOS: 55 IU/L (ref 39–117)
ALT: 14 IU/L (ref 0–32)
AST: 17 IU/L (ref 0–40)
Albumin/Globulin Ratio: 1.8 (ref 1.2–2.2)
BILIRUBIN TOTAL: 0.5 mg/dL (ref 0.0–1.2)
BUN / CREAT RATIO: 13 (ref 9–23)
BUN: 12 mg/dL (ref 6–24)
CO2: 21 mmol/L (ref 20–29)
CREATININE: 0.93 mg/dL (ref 0.57–1.00)
Calcium: 9.2 mg/dL (ref 8.7–10.2)
Chloride: 104 mmol/L (ref 96–106)
GFR, EST AFRICAN AMERICAN: 85 mL/min/{1.73_m2} (ref >59)
GFR, EST NON AFRICAN AMERICAN: 73 mL/min/{1.73_m2} (ref >59)
GLUCOSE: 84 mg/dL (ref 65–99)
Globulin, Total: 2.2 g/dL (ref 1.5–4.5)
Potassium: 4 mmol/L (ref 3.5–5.2)
SODIUM: 140 mmol/L (ref 134–144)
Total Protein: 6.2 g/dL (ref 6.0–8.5)

## 2018-05-21 LAB — LIPID PANEL WITH LDL/HDL RATIO
Cholesterol, Total: 223 mg/dL — ABNORMAL HIGH (ref 100–199)
HDL: 42 mg/dL (ref >39)
LDL Calculated: 155 mg/dL — ABNORMAL HIGH (ref 0–99)
LDl/HDL Ratio: 3.7 ratio — ABNORMAL HIGH (ref 0.0–3.2)
Triglycerides: 129 mg/dL (ref 0–149)
VLDL Cholesterol Cal: 26 mg/dL (ref 5–40)

## 2019-05-26 ENCOUNTER — Telehealth: Payer: Self-pay

## 2019-05-26 NOTE — Telephone Encounter (Signed)
That would not be OK with me. She can make an appointment. I will not analyze her urine culture without one. Same for the issue of a rash.

## 2019-05-26 NOTE — Telephone Encounter (Signed)
FYI...  Pt refuses to see anyone other than [name].  His first available appointment is not until Monday 06/01/2019.  She states she can not wait that long and will "talk to [facility] about this.  At that point pt disconnected the call.    -Mickel Baas

## 2019-05-26 NOTE — Telephone Encounter (Signed)
Pt stated now she has noticed a rash and she can not wait until Friday for an appt with Vernie Murders. Pt stated she would like a call back because she would like to be seen asap

## 2019-05-26 NOTE — Telephone Encounter (Signed)
Copied from [place] [id number]. Topic: General - Inquiry >> May 26, 2019  8:57 AM Scherrie Gerlach wrote: Reason for CRM: pt would like to drop off UA.  Pt states [name] is aware of her ongoing bladder issue, and she is busy and cannot declined a virtual.  Pt asked that I send message and see if he will put order in.

## 2019-05-27 ENCOUNTER — Encounter: Payer: Self-pay | Admitting: Emergency Medicine

## 2019-05-27 ENCOUNTER — Other Ambulatory Visit: Payer: Self-pay

## 2019-05-27 ENCOUNTER — Ambulatory Visit
Admission: EM | Admit: 2019-05-27 | Discharge: 2019-05-27 | Disposition: A | Discharge status: Home / Self Care | Payer: Managed Care, Other (non HMO) | Attending: Family Medicine | Admitting: Family Medicine

## 2019-05-27 DIAGNOSIS — R202 Paresthesia of skin: Secondary | ICD-10-CM | POA: Diagnosis not present

## 2019-05-27 DIAGNOSIS — R350 Frequency of micturition: Secondary | ICD-10-CM | POA: Diagnosis not present

## 2019-05-27 LAB — URINALYSIS, COMPLETE (UACMP) WITH MICROSCOPIC
Bilirubin Urine: NEGATIVE
Glucose, UA: NEGATIVE mg/dL
Hgb urine dipstick: NEGATIVE
Ketones, ur: NEGATIVE mg/dL
Leukocytes,Ua: NEGATIVE
Nitrite: NEGATIVE
Protein, ur: NEGATIVE mg/dL
Specific Gravity, Urine: 1.015 (ref 1.005–1.030)
pH: 7 (ref 5.0–8.0)

## 2019-05-27 NOTE — ED Triage Notes (Signed)
Pt c/o bloating/lower abdominal pain, tingling in her feet, urinary frequency, dysuria, started about a month ago. She states this happened about a year ago also. She has an appt on Monday with her PCP but did not want to wait. She also has a rash on her back, legs and hands.

## 2019-05-27 NOTE — Discharge Instructions (Signed)
Recommend follow up with Primary Care provider for referral to neurologist

## 2019-05-28 LAB — URINE CULTURE
Culture: <10000 — AB
Special Requests: NORMAL

## 2019-05-31 NOTE — ED Provider Notes (Signed)
[facility]    CSN: [id number] Arrival date & time: 05/27/19  1329      History   Chief Complaint Chief Complaint  Patient presents with  . Abdominal Pain  . Urinary Frequency    HPI Kristen Meyer is a 48 y.o. female.   48 yo female with a c/o lower abdominal discomfort, urinary frequency and burning with urination for the past one month. States she also feels intermittent tingling of her feet. Reports she had a similar episode one year ago and and has had some work up done for this. Denies any fevers, chills, vomiting, diarrhea.    Abdominal Pain Urinary Frequency Associated symptoms include abdominal pain.    Past Medical History:  Diagnosis Date  . Abnormal uterine bleeding   . Complication of anesthesia   . Ovarian cyst    left  . PONV (postoperative nausea and vomiting)   . Thyroid nodule     Patient Active Problem List   Diagnosis Date Noted  . Pelvic pain 07/12/2016  . Hematuria 07/12/2016  . Status post endometrial ablation 07/12/2016  . Status post tubal ligation 07/12/2016  . Allergic rhinitis 11/23/2015  . History of changes in urinary output 11/23/2015  . Mild reactive airways disease 11/23/2015  . CN (constipation) 02/24/2015  . Acid indigestion 02/24/2015  . LBP (low back pain) 02/24/2015  . Galactorrhea not associated with childbirth 02/22/2009  . Enthesopathy 10/28/2008  . Anemia, iron deficiency 03/11/2007    Past Surgical History:  Procedure Laterality Date  . ABLATION     uterine  . BREAST ENHANCEMENT SURGERY    . BUNIONECTOMY Right 2008  . DILATION AND CURETTAGE OF UTERUS    . HYSTEROSCOPY    . LAPAROSCOPY N/A 08/06/2016   Procedure: LAPAROSCOPY DIAGNOSTIC;  Surgeon: Herold HarmsMartin A Defrancesco, MD;  Location: [facility] ORS;  Service: Gynecology;  Laterality: N/A;  . SALPINGECTOMY Right    partial  . TONSILLECTOMY AND ADENOIDECTOMY  1988  . TUBAL LIGATION  1994    OB History    Gravida  2   Para  2   Term  2   Preterm      AB      Living  2     SAB      TAB      Ectopic      Multiple      Live Births  2            Home Medications    Prior to Admission medications   Medication Sig Start Date End Date Taking? Authorizing Provider  albuterol (PROVENTIL HFA;VENTOLIN HFA) 108 (90 Base) MCG/ACT inhaler Inhale 2 puffs into the lungs every 6 (six) hours as needed for wheezing or shortness of breath. 05/15/18  Yes Kristen Meyer, Kristen Ciproennis Meyer, Kristen Meyer  fluticasone (FLONASE) 50 MCG/ACT nasal spray Place 2 sprays into both nostrils daily. 05/15/18  Yes Kristen Meyer, Kristen Ciproennis Meyer, Kristen Meyer  magnesium oxide (MAG-OX) 400 MG tablet Take 400 mg by mouth daily.   Yes [provider]  cyanocobalamin 1000 MCG tablet Take 1,000 mcg by mouth daily.    [provider]  ibuprofen (ADVIL,MOTRIN) 800 MG tablet Take 1 tablet (800 mg total) by mouth 3 (three) times daily. 02/18/18   Kristen Meyer, Kristen Ciproennis Meyer, Kristen Meyer  LORazepam (ATIVAN) 0.5 MG tablet One tablet by mouth 2-3 hours prior to procedure - May repeat at appointment if needed. 02/28/18   Kristen Meyer, Kristen Ciproennis Meyer, Kristen Meyer  ondansetron (ZOFRAN) 4 MG tablet Take one tablet by  mouth 1 hour prior to procedure if nausea develops. May repeat in 3-4 hours if needed. 02/28/18   Kristen Meyer, Kristen Cipro, Kristen Meyer    Family History Family History  Problem Relation Age of Onset  . COPD Mother   . Healthy Father   . Healthy Sister   . Healthy Brother   . Breast cancer Neg Hx   . Ovarian cancer Neg Hx   . Colon cancer Neg Hx   . Diabetes Neg Hx   . Heart disease Neg Hx     Social History Social History   Tobacco Use  . Smoking status: Former Smoker    Quit date: 08/02/1986    Years since quitting: 32.8  . Smokeless tobacco: Never Used  . Tobacco comment: QUIT IN 1992  Substance Use Topics  . Alcohol use: No    Alcohol/week: 0.0 standard drinks  . Drug use: No     Allergies   Amoxicillin, Penicillins, Phenergan [promethazine hcl], Clarithromycin, Latex, Sulfa antibiotics, and  Transderm-scop [scopolamine]   Review of Systems Review of Systems  Gastrointestinal: Positive for abdominal pain.  Genitourinary: Positive for frequency.     Physical Exam Triage Vital Signs ED Triage Vitals  Enc Vitals Group     BP 05/27/19 1404 125/82     Pulse Rate 05/27/19 1404 88     Resp 05/27/19 1404 18     Temp 05/27/19 1404 98.1 F (36.7 C)     Temp Source 05/27/19 1404 Oral     SpO2 05/27/19 1404 98 %     Weight 05/27/19 1402 156 lb (70.8 kg)     Height 05/27/19 1402 5\' 7"  (1.702 m)     Head Circumference --      Peak Flow --      Pain Score 05/27/19 1400 3     Pain Loc --      Pain Edu? --      Excl. in GC? --    No data found.  Updated Vital Signs BP 125/82 (BP Location: Left Arm)   Pulse 88   Temp 98.1 F (36.7 C) (Oral)   Resp 18   Ht 5\' 7"  (1.702 m)   Wt 70.8 kg   SpO2 98%   BMI 24.43 kg/m   Visual Acuity Right Eye Distance:   Left Eye Distance:   Bilateral Distance:    Right Eye Near:   Left Eye Near:    Bilateral Near:     Physical Exam Vitals and nursing note reviewed.  Constitutional:      General: She is not in acute distress.    Appearance: She is not diaphoretic.  Pulmonary:     Effort: Pulmonary effort is normal. No respiratory distress.     Breath sounds: Normal breath sounds. No stridor. No wheezing, rhonchi or rales.  Abdominal:     General: Bowel sounds are normal. There is no distension.     Palpations: Abdomen is soft. There is no mass.     Tenderness: There is no abdominal tenderness. There is no right CVA tenderness, left CVA tenderness, guarding or rebound.     Hernia: No hernia is present.  Neurological:     General: No focal deficit present.     Mental Status: She is alert.     Deep Tendon Reflexes: Reflexes normal.      UC Treatments / Results  Labs (all labs ordered are listed, but only abnormal results are displayed) Labs Reviewed  URINE CULTURE - Abnormal; Notable for  the following components:       Result Value   Culture   (*)    Value: <10,000 COLONIES/mL INSIGNIFICANT GROWTH Performed at [facility], [place] [address], [place], [place] [address]    All other components within normal limits  URINALYSIS, COMPLETE (UACMP) WITH MICROSCOPIC - Abnormal; Notable for the following components:   Bacteria, UA FEW (*)    All other components within normal limits    EKG   Radiology No results found.  Procedures Procedures (including critical care time)  Medications Ordered in UC Medications - No data to display  Initial Impression / Assessment and Plan / UC Course  I have reviewed the triage vital signs and the nursing notes.  Pertinent labs & imaging results that were available during my care of the patient were reviewed by me and considered in my medical decision making (see chart for details).      Final Clinical Impressions(s) / UC Diagnoses   Final diagnoses:  Paresthesias  Urinary frequency     Discharge Instructions     Recommend follow up with Primary Care provider for referral to neurologist    ED Prescriptions    None     1. Lab results and diagnosis reviewed with patient 2. Follow up with PCP for further evaluation and/or referral to specialist   PDMP not reviewed this encounter.   Norval Gable, MD 05/31/19 [id number]

## 2019-05-31 NOTE — Telephone Encounter (Signed)
Please apologize for me. I see she had gone to the [facility]. Since it has been over a year since seen here, we should schedule an in-the-office appointment if having any persistent issues not handled at the urgent care clinic.

## 2019-06-02 ENCOUNTER — Encounter: Payer: Self-pay | Admitting: Adult Health

## 2019-06-02 ENCOUNTER — Other Ambulatory Visit: Payer: Self-pay

## 2019-06-02 ENCOUNTER — Other Ambulatory Visit (HOSPITAL_COMMUNITY)
Admission: RE | Admit: 2019-06-02 | Discharge: 2019-06-02 | Disposition: A | Discharge status: Home / Self Care | Payer: Managed Care, Other (non HMO) | Source: Ambulatory Visit | Attending: Adult Health | Admitting: Adult Health

## 2019-06-02 ENCOUNTER — Ambulatory Visit: Payer: Managed Care, Other (non HMO) | Admitting: Adult Health

## 2019-06-02 VITALS — BP 102/70 | HR 68 | Temp 96.7°F | Resp 16 | Wt 159.8 lb

## 2019-06-02 DIAGNOSIS — R202 Paresthesia of skin: Secondary | ICD-10-CM | POA: Diagnosis not present

## 2019-06-02 DIAGNOSIS — Z113 Encounter for screening for infections with a predominantly sexual mode of transmission: Secondary | ICD-10-CM | POA: Diagnosis not present

## 2019-06-02 DIAGNOSIS — M545 Low back pain, unspecified: Secondary | ICD-10-CM

## 2019-06-02 DIAGNOSIS — R102 Pelvic and perineal pain: Secondary | ICD-10-CM | POA: Insufficient documentation

## 2019-06-02 DIAGNOSIS — Z8719 Personal history of other diseases of the digestive system: Secondary | ICD-10-CM

## 2019-06-02 LAB — POCT URINALYSIS DIPSTICK
Bilirubin, UA: NEGATIVE
Blood, UA: NEGATIVE
Glucose, UA: NEGATIVE
Leukocytes, UA: NEGATIVE
Nitrite, UA: NEGATIVE
Protein, UA: NEGATIVE
Spec Grav, UA: 1.025 (ref 1.010–1.025)
Urobilinogen, UA: 0.2 E.U./dL
pH, UA: 6 (ref 5.0–8.0)

## 2019-06-02 NOTE — Patient Instructions (Signed)
Pelvic Exam A pelvic exam is an exam of a woman's outer and inner genitals and reproductive organs. Pelvic exams are done to screen for health problems and to help prevent health problems from developing. During your pelvic exam, your health care provider may ask you questions about your health, your family's health, your menstrual periods, immunizations, and your sexual activity. The information shared between you and your health care provider will not be shared with anyone else. Who should have a pelvic exam? Talk with your health care provider about when and how often you should have a pelvic exam. There are many possible reasons you might have a pelvic exam. A pelvic exam may be recommended to check for:  Normal development and function of the reproductive organs.  Cancer of the cervix, ovaries, uterus, or vagina.  Signs of sexually transmitted infections (STIs) or other types of infections.  Pregnancy. If you are pregnant, a pelvic exam can also help determine how far along you are in your pregnancy.  Widening (dilation) of the cervix during labor.  Injury (trauma) to the reproductive organs. How is a pelvic exam done? Usually, a physical exam is done first. This may include:  An exam of your breasts. Your health care provider may feel your breasts to check for abnormalities.  An exam of your abdomen. Your health care provider may press on your abdomen to check for abnormalities. Pelvic exams may vary among health care providers and hospitals. The following things are usually done during a pelvic exam:  You will remove your clothes from the waist down. You will put on a gown or a wrap to cover yourself while you get ready for the exam.  You will lie on your back on an examination table. Your feet will be placed into foot rests (stirrups) so that your legs are wide apart and your knees are bent. A drape will be placed over your abdomen and your legs.  Your health care provider will  wear gloves and examine your outer genitals to check for anything unusual. This includes your clitoris, urethra, vaginal opening, labia, and the skin between your vagina and your anus (perineum).  Your health care provider will examine your inner genitals. To do this, a lubricated instrument (speculum) will be inserted into your vagina. The speculum will be widened to open the walls of your vagina. ? Your health care provider will examine your vagina and cervix. ? A Pap test, cervical biopsy, or cultures may be done as needed. ? After the internal exam is done, the speculum will be removed.  Your health care provider will insert two fingers into your vagina to gently press against various organs. ? Your health care provider may use his or her other hand to gently press on your lower abdomen while doing this. Depending on the purpose of your pelvic exam, your health care provider may perform:  A Pap test. This is sometimes called a Pap smear. It is a screening test that is used to check for signs of cancer of the cervix. The test can also identify the presence of infection or precancerous changes.  A cervical biopsy. This is the removal of a small sample of tissue from the cervix. The cervix is the lowest part of the womb (uterus), which opens into the vagina (birth canal). The tissue will be checked under a microscope.  Other diagnostic tests that involve taking samples of tissue or fluid (cultures).  If you have tests done, it is your responsibility to   get your test results. Ask your health care provider or the department performing the test when your results will be ready. What are the benefits of a pelvic exam? A pelvic exam may be recommended to help explain or diagnose:  Changes in your body that may be signs of cancer in the reproductive system.  Inability to get pregnant (infertility).  Vaginal itching or burning.  Abnormal vaginal discharge or bleeding.  Problems with sexual  function.  Problems with urination, such as: ? Painful urination. ? Frequent urinary tract infections. ? Inability to control when you urinate (urinary incontinence).  Problems with menstrual periods, such as: ? Severe cramping. ? Absence of any menstrual flow in a female by the age of 15 years (primary amenorrhea). ? Absence of menstrual flow for 3-6 months at a time (secondary amenorrhea).  Problems with the position of your pelvic organs due to weakening muscles (prolapse), such as: ? Uterine prolapse. ? Bladder prolapse. ? Rectal prolapse. What are the risks of a pelvic exam?  A pelvic exam is usually painless, although it can cause mild discomfort. If you experience pain at any time during your pelvic exam, tell your health care provider right away.  You may have very light bleeding after a pelvic exam, particularly if a biopsy or cultures were obtained. When should I seek medical care? Seek medical care after your pelvic exam if:  You develop new symptoms.  You experience pain or discomfort from anything that was done during your pelvic exam. Summary  A pelvic exam is an exam of a woman's outer and inner genitals and reproductive organs.  A pelvic exam may be recommended to help explain or diagnose various problems with your pelvic organs.  You may experience mild discomfort during a pelvic exam. This information is not intended to replace advice given to you by your health care provider. Make sure you discuss any questions you have with your health care provider. Document Released: 08/18/2002 Document Revised: 10/15/2017 Document Reviewed: 10/15/2017 Elsevier Patient Education  2020 [employer]  

## 2019-06-02 NOTE — Progress Notes (Signed)
Patient: Kristen OharaChristina A Bramble Female    DOB: June 18, 1970   48 y.o.   MRN: [id number] Visit Date: 06/02/2019  Today's Provider: Jairo BenMichelle Smith Flinchum, FNP   Chief Complaint  Patient presents with  . Transitions Of Care   Subjective:     HPI  Transition Into Care  [facility] with complaints of abdominal pan and urinary frequency x1week. Urinalysis performed in clinic showed bacteria in the urine, specimen was sent of for culture ( no growth <10000 colony). Patient reports today that symptoms have not improved and reports lower back pain shooting down her legs that she describes as feeling like a "hot poker", patient states that abdominal pain has not improved. Patient states that since onset of symptoms she still has difficulty sleeping at night, headaches and reports weight loss. Patient reports that she has a history of back pain but feels that it is not similar as before. Patient has been taking otc Nsaids with no relief.      She reports this happened around a year ago and thought yard work might have caused her to get inflamed. She does have chronic constipations's. Last bowel movement - 06/02/19 was normal. Denies any bleeding.   She reports to provider that the hot poker sensation is intermittent and not occuring presently. She was having it when seen at urgent care on 05/27/2019.  Mild discharge vaginally, history of ablation and she reports she does not usually have vaginal discharge. Denies any bleeding. . Had mild vaginal pain when she was seen at urgent care.  One partner for her she reports though  she is unsure of his exposures. She is concerned and asks to be tested for STD's today.  No history of recent period due to ablation.  Tubal ablation.  She requests a pelvic exam and testing today.   She has seen [facility], seen no cause for paresthesia in her legs is what she was told. She tried physical therapy.  She  also saw neurosurgery. She denies any change in symptoms since that time. MRI below. Denies any injury or trauma.   Patient  denies any fever, body aches,chills, rash, chest pain, shortness of breath, nausea, vomiting, or diarrhea.   ------------------------------------------------------------------------------------  Study Result  CLINICAL DATA:  48 y/o F; lower back pain after yard work earlier this month. Bilateral foot pain.  EXAM: MRI LUMBAR SPINE WITHOUT CONTRAST  TECHNIQUE: Multiplanar, multisequence MR imaging of the lumbar spine was performed. No intravenous contrast was administered.  COMPARISON:  05/04/2015 CT abdomen and pelvis.  FINDINGS: Segmentation:  Standard.  Alignment:  Physiologic.  Vertebrae:  No fracture, evidence of discitis, or bone lesion.  Conus medullaris and cauda equina: Conus extends to the L1 level. Conus and cauda equina appear normal.  Paraspinal and other soft tissues: Negative.  Disc levels:  L1-2: No significant disc displacement, foraminal stenosis, or canal stenosis.  L2-3: No significant disc displacement, foraminal stenosis, or canal stenosis.  L3-4: No significant disc displacement, foraminal stenosis, or canal stenosis.  L4-5: No significant disc displacement, foraminal stenosis, or canal stenosis.  L5-S1: Disc desiccation with mild loss of intervertebral disc space height. Small central disc protrusion with ventral thecal sac effacement. No significant foraminal or canal stenosis.  IMPRESSION: 1. L5-S1 mild discogenic degenerative changes. No significant foraminal or canal stenosis. 2. No acute osseous abnormality or malalignment.   Electronically Signed   By: Mitzi HansenLance  Furusawa-Stratton M.D.   On:  03/01/2018 05:02      Allergies  Allergen Reactions  . Amoxicillin Hives    Has patient had a PCN reaction causing immediate rash, facial/tongue/throat swelling, SOB or lightheadedness with  hypotension: No Has patient had a PCN reaction causing severe rash involving mucus membranes or skin necrosis: Yes Has patient had a PCN reaction that required hospitalization No Has patient had a PCN reaction occurring within the last 10 years: No If all of the above answers are "NO", then may proceed with Cephalosporin use.   Marland Kitchen Penicillins   . Phenergan [Promethazine Hcl] Nausea And Vomiting  . Clarithromycin Nausea And Vomiting and Rash  . Latex Itching and Rash    burns  . Sulfa Antibiotics Itching, Nausea And Vomiting and Rash    Other reaction(s): Unknown  . Transderm-Scop [Scopolamine] Rash    n/v     Current Outpatient Medications:  .  albuterol (PROVENTIL HFA;VENTOLIN HFA) 108 (90 Base) MCG/ACT inhaler, Inhale 2 puffs into the lungs every 6 (six) hours as needed for wheezing or shortness of breath., Disp: 1 Inhaler, Rfl: 2 .  fluticasone (FLONASE) 50 MCG/ACT nasal spray, Place 2 sprays into both nostrils daily., Disp: 18.2 g, Rfl: 3 .  ibuprofen (ADVIL,MOTRIN) 800 MG tablet, Take 1 tablet (800 mg total) by mouth 3 (three) times daily., Disp: 30 tablet, Rfl: 1 .  LORazepam (ATIVAN) 0.5 MG tablet, One tablet by mouth 2-3 hours prior to procedure - May repeat at appointment if needed., Disp: 5 tablet, Rfl: 0 .  magnesium oxide (MAG-OX) 400 MG tablet, Take 400 mg by mouth daily., Disp: , Rfl:  .  cyanocobalamin 1000 MCG tablet, Take 1,000 mcg by mouth daily., Disp: , Rfl:  .  ondansetron (ZOFRAN) 4 MG tablet, Take one tablet by mouth 1 hour prior to procedure if nausea develops. May repeat in 3-4 hours if needed. (Patient not taking: Reported on 06/02/2019), Disp: 2 tablet, Rfl: 0  Review of Systems  Constitutional: Positive for unexpected weight change.  Gastrointestinal: Positive for abdominal pain, constipation and nausea.  Musculoskeletal: Positive for back pain.    Social History   Tobacco Use  . Smoking status: Former Smoker    Quit date: 08/02/1986    Years since  quitting: 32.8  . Smokeless tobacco: Never Used  . Tobacco comment: QUIT IN 1992  Substance Use Topics  . Alcohol use: No    Alcohol/week: 0.0 standard drinks      Objective:   There were no vitals taken for this visit. There were no vitals filed for this visit.There is no height or weight on file to calculate BMI.   Physical Exam Vitals reviewed.  Constitutional:      General: She is not in acute distress.    Appearance: Normal appearance. She is well-developed and normal weight. She is not ill-appearing, toxic-appearing or diaphoretic.     Interventions: She is not intubated.    Comments: Patient is alert and oriented and responsive to questions Engages in eye contact with provider. Speaks in full sentences without any pauses without any shortness of breath or distress.  Patient moves on and off of exam table and in room without difficulty. Gait is normal in hall and in room. Patient is oriented to person place time and situation. Patient answers questions appropriately and engages in conversation.   HENT:     Head: Normocephalic and atraumatic.     Right Ear: External ear normal.     Left Ear: External ear normal.  Nose: Nose normal.     Mouth/Throat:     Pharynx: No oropharyngeal exudate.  Eyes:     General: Lids are normal. No scleral icterus.       Right eye: No discharge.        Left eye: No discharge.     Conjunctiva/sclera: Conjunctivae normal.     Right eye: Right conjunctiva is not injected. No exudate or hemorrhage.    Left eye: Left conjunctiva is not injected. No exudate or hemorrhage.    Pupils: Pupils are equal, round, and reactive to light.  Neck:     Thyroid: No thyroid mass or thyromegaly.     Vascular: Normal carotid pulses. No carotid bruit, hepatojugular reflux or JVD.     Trachea: Trachea and phonation normal. No tracheal tenderness or tracheal deviation.     Meningeal: Brudzinski's sign and Kernig's sign absent.  Cardiovascular:     Rate and  Rhythm: Normal rate and regular rhythm.     Pulses: Normal pulses.          Radial pulses are 2+ on the right side and 2+ on the left side.       Dorsalis pedis pulses are 2+ on the right side and 2+ on the left side.       Posterior tibial pulses are 2+ on the right side and 2+ on the left side.     Heart sounds: Normal heart sounds, S1 normal and S2 normal. Heart sounds not distant. No murmur. No friction rub. No gallop.   Pulmonary:     Effort: Pulmonary effort is normal. No tachypnea, bradypnea, accessory muscle usage or respiratory distress. She is not intubated.     Breath sounds: Normal breath sounds. No stridor. No wheezing or rales.  Chest:     Chest wall: No tenderness.  Abdominal:     General: Bowel sounds are normal. There is no distension or abdominal bruit. There are no signs of injury.     Palpations: Abdomen is soft. There is no shifting dullness, fluid wave, hepatomegaly, splenomegaly, mass or pulsatile mass.     Tenderness: There is abdominal tenderness in the suprapubic area. There is no right CVA tenderness, left CVA tenderness, guarding or rebound. Negative signs include Murphy's sign and McBurney's sign.     Hernia: No hernia is present.       Comments: Mild suprapubic tenderness with palpation.   Genitourinary:    Exam position: Supine.     Pubic Area: No rash.      Labia:        Right: No rash, tenderness, lesion or injury.        Left: No rash, tenderness, lesion or injury.      Urethra: No prolapse, urethral pain, urethral swelling or urethral lesion.     Vagina: Normal.     Cervix: Normal and dilated.     Uterus: Normal.      Adnexa: Right adnexa normal and left adnexa normal.       Right: No mass, tenderness or fullness.         Left: No mass, tenderness or fullness.       Rectum: Normal. No external hemorrhoid or internal hemorrhoid.  Musculoskeletal:        General: No tenderness or deformity. Normal range of motion.     Cervical back: Full passive  range of motion without pain, normal range of motion and neck supple. No edema, erythema or rigidity. No spinous process tenderness or  muscular tenderness. Normal range of motion.  Lymphadenopathy:     Head:     Right side of head: No submental, submandibular, tonsillar, preauricular, posterior auricular or occipital adenopathy.     Left side of head: No submental, submandibular, tonsillar, preauricular, posterior auricular or occipital adenopathy.     Cervical: No cervical adenopathy.     Right cervical: No superficial, deep or posterior cervical adenopathy.    Left cervical: No superficial, deep or posterior cervical adenopathy.     Upper Body:     Right upper body: No supraclavicular or pectoral adenopathy.     Left upper body: No supraclavicular or pectoral adenopathy.  Skin:    General: Skin is warm and dry.     Coloration: Skin is not pale.     Findings: No abrasion, bruising, burn, ecchymosis, erythema, lesion, petechiae or rash.     Nails: There is no clubbing.  Neurological:     General: No focal deficit present.     Mental Status: She is alert and oriented to person, place, and time.     GCS: GCS eye subscore is 4. GCS verbal subscore is 5. GCS motor subscore is 6.     Cranial Nerves: No cranial nerve deficit.     Sensory: No sensory deficit.     Motor: No weakness, tremor, atrophy, abnormal muscle tone or seizure activity.     Coordination: Coordination normal.     Gait: Gait normal.     Deep Tendon Reflexes: Reflexes are normal and symmetric. Reflexes normal. Babinski sign absent on the right side. Babinski sign absent on the left side.     Reflex Scores:      Tricep reflexes are 2+ on the right side and 2+ on the left side.      Bicep reflexes are 2+ on the right side and 2+ on the left side.      Brachioradialis reflexes are 2+ on the right side and 2+ on the left side.      Patellar reflexes are 2+ on the right side and 2+ on the left side.      Achilles reflexes are 2+  on the right side and 2+ on the left side. Psychiatric:        Mood and Affect: Mood normal.        Speech: Speech normal.        Behavior: Behavior normal.        Thought Content: Thought content normal.        Judgment: Judgment normal.      No results found for any visits on 06/02/19.     Assessment & Plan     She declines lumbar x ray- denies any new injury or trauma.  She has had previous MRI and been evaluated by neurosurgeon.   1. Low back pain with radiation History of Chronic constipation.   - POCT urinalysis dipstick - CBC with Differential/Platelet 005009 - Comprehensive Metabolic Panel (CMET) - TSH - H. pylori breath test - RPR - HIV screening - Hepatitis panel, acute 829562 - DG Abd 1 View; Future - Cervicovaginal ancillary only  2. Pelvic pain Normal pelvic exam.   - Cervicovaginal ancillary only  POCT urinalysis dipstick - CBC with Differential/Platelet 005009 - Comprehensive Metabolic Panel (CMET) - TSH - H. pylori breath test - RPR - HIV screening - Hepatitis panel, acute 130865 - DG Abd 1 View; Future - Cervicovaginal ancillary only    3. Paresthesias- lower extremities Lumbar x- ray -  she declined at this time see above.   4. Screen for STD (sexually transmitted disease)  Patient requested screening.   Return in about 2 weeks (around 06/16/2019), or if symptoms worsen or fail to improve, for Go to Emergency room/ urgent care if worse.   Advised patient call the office or your primary care doctor for an appointment if no improvement within 72 hours or if any symptoms change or worsen at any time  Advised ER or urgent Care if after hours or on weekend. Call 911 for emergency symptoms at any time.Patinet verbalized understanding of all instructions given/reviewed and treatment plan and has no further questions or concerns at this time.        The entirety of the information documented in the History of Present Illness, Review of Systems  and Physical Exam were personally obtained by me. Portions of this information were initially documented by the  Certified Medical Assistant whose name is documented in Epic and reviewed by me for thoroughness and accuracy.  I have personally performed the exam and reviewed the chart and it is accurate to the best of my knowledge.  Museum/gallery conservator has been used and any errors in dictation or transcription are unintentional.  Eula Fried. Flinchum FNP-C  [facility]   Jairo Ben, FNP  [facility]

## 2019-06-03 LAB — CERVICOVAGINAL ANCILLARY ONLY
Bacterial Vaginitis (gardnerella): NEGATIVE
Candida Glabrata: NEGATIVE
Candida Vaginitis: NEGATIVE
Chlamydia: NEGATIVE
Comment: NEGATIVE
Comment: NEGATIVE
Comment: NEGATIVE
Comment: NEGATIVE
Comment: NEGATIVE
Comment: NORMAL
Neisseria Gonorrhea: NEGATIVE
Trichomonas: NEGATIVE

## 2019-06-03 LAB — COMPREHENSIVE METABOLIC PANEL
ALT: 11 IU/L (ref 0–32)
AST: 15 IU/L (ref 0–40)
Albumin/Globulin Ratio: 2 (ref 1.2–2.2)
Albumin: 4.4 g/dL (ref 3.8–4.8)
Alkaline Phosphatase: 61 IU/L (ref 39–117)
BUN/Creatinine Ratio: 15 (ref 9–23)
BUN: 12 mg/dL (ref 6–24)
Bilirubin Total: <0.2 mg/dL (ref 0.0–1.2)
CO2: 24 mmol/L (ref 20–29)
Calcium: 9.1 mg/dL (ref 8.7–10.2)
Chloride: 103 mmol/L (ref 96–106)
Creatinine, Ser: 0.81 mg/dL (ref 0.57–1.00)
GFR calc Af Amer: 99 mL/min/{1.73_m2} (ref >59)
GFR calc non Af Amer: 86 mL/min/{1.73_m2} (ref >59)
Globulin, Total: 2.2 g/dL (ref 1.5–4.5)
Glucose: 95 mg/dL (ref 65–99)
Potassium: 4 mmol/L (ref 3.5–5.2)
Sodium: 139 mmol/L (ref 134–144)
Total Protein: 6.6 g/dL (ref 6.0–8.5)

## 2019-06-03 LAB — CBC WITH DIFFERENTIAL/PLATELET
Basophils Absolute: 0.1 10*3/uL (ref 0.0–0.2)
Basos: 1 %
EOS (ABSOLUTE): 0.3 10*3/uL (ref 0.0–0.4)
Eos: 4 %
Hematocrit: 38.1 % (ref 34.0–46.6)
Hemoglobin: 12.5 g/dL (ref 11.1–15.9)
Immature Grans (Abs): 0 10*3/uL (ref 0.0–0.1)
Immature Granulocytes: 0 %
Lymphocytes Absolute: 1.6 10*3/uL (ref 0.7–3.1)
Lymphs: 24 %
MCH: 30.6 pg (ref 26.6–33.0)
MCHC: 32.8 g/dL (ref 31.5–35.7)
MCV: 93 fL (ref 79–97)
Monocytes Absolute: 0.5 10*3/uL (ref 0.1–0.9)
Monocytes: 8 %
Neutrophils Absolute: 4.2 10*3/uL (ref 1.4–7.0)
Neutrophils: 63 %
Platelets: 352 10*3/uL (ref 150–450)
RBC: 4.08 x10E6/uL (ref 3.77–5.28)
RDW: 11.7 % (ref 11.7–15.4)
WBC: 6.6 10*3/uL (ref 3.4–10.8)

## 2019-06-03 LAB — HEPATITIS PANEL, ACUTE
Hep A IgM: NEGATIVE
Hep B C IgM: NEGATIVE
Hep C Virus Ab: <0.1 s/co ratio (ref 0.0–0.9)
Hepatitis B Surface Ag: NEGATIVE

## 2019-06-03 LAB — HIV ANTIBODY (ROUTINE TESTING W REFLEX): HIV Screen 4th Generation wRfx: NONREACTIVE

## 2019-06-03 LAB — RPR: RPR Ser Ql: NONREACTIVE

## 2019-06-03 LAB — TSH: TSH: 1.36 u[IU]/mL (ref 0.450–4.500)

## 2019-06-03 NOTE — Progress Notes (Signed)
CBC is normal, no anemia, platelets normal, no signs of infection  CMP- kidney and liver function normal. Electrolytes normal.  TSH - thyroid within normal limits.  RPR negative for syphilis  HIV non reactive / negative.  Hepatitis A, B, C negative.  Keep follow up as discussed. Vaginal swab still pending as well as flat plat x ray to rule out constipation.

## 2019-06-04 LAB — H. PYLORI BREATH TEST: H pylori Breath Test: NEGATIVE

## 2019-06-08 ENCOUNTER — Telehealth: Payer: Self-pay

## 2019-06-08 NOTE — Telephone Encounter (Signed)
Patient was advised of labs in chart, patient had concerns because vaginal swab was sent to cytology at [facility] and not sent to [facility]. Patient states that she is a [facility] and all her labs are to be done through them so she does not have to pay out of pocket. I advised patient that cytology report has been reported in system and was not sure in regards to billing. Patient is requesting that we go back in system and change this. Please Advise. KW

## 2019-06-08 NOTE — Telephone Encounter (Signed)
-----   Message from Doreen Beam, Johnson sent at 06/03/2019  1:34 PM EST ----- CBC is normal, no anemia, platelets normal, no signs of infection  CMP- kidney and liver function normal. Electrolytes normal.  TSH - thyroid within normal limits.  RPR negative for syphilis  HIV non reactive / negative.  Hepatitis A, B, C negative.  Keep follow up as discussed. Vaginal swab still pending as well as flat plat x ray to rule out constipation.

## 2019-06-10 ENCOUNTER — Ambulatory Visit
Admission: RE | Admit: 2019-06-10 | Discharge: 2019-06-10 | Disposition: A | Discharge status: Home / Self Care | Payer: Managed Care, Other (non HMO) | Source: Ambulatory Visit | Attending: Adult Health | Admitting: Adult Health

## 2019-06-10 ENCOUNTER — Ambulatory Visit
Admission: RE | Admit: 2019-06-10 | Discharge: 2019-06-10 | Disposition: A | Discharge status: Home / Self Care | Payer: Managed Care, Other (non HMO) | Source: Other Acute Inpatient Hospital | Attending: Adult Health | Admitting: Adult Health

## 2019-06-10 ENCOUNTER — Other Ambulatory Visit: Payer: Self-pay

## 2019-06-10 DIAGNOSIS — M545 Low back pain, unspecified: Secondary | ICD-10-CM

## 2019-06-10 NOTE — Telephone Encounter (Signed)
Pt wanted to also check on the status on her [facility]/ PAP billing issue/ please advise

## 2019-06-10 NOTE — Telephone Encounter (Signed)
Pt wants to go to [facility] imaging center for an abdominal xray but today she feels fine but was in pain all night and wanted to see if the doctor has any information on her abdominal issue

## 2019-06-10 NOTE — Telephone Encounter (Signed)
Patient has been advised that pap smear was sent to  cytology at [facility], I believe there was confusion at time of visit because labs were drawn through [facility]. Is there anyway I can fix this billing issue for patient since we sent pap out? KW

## 2019-06-10 NOTE — Telephone Encounter (Signed)
From [facility] 

## 2019-06-17 ENCOUNTER — Telehealth: Payer: Self-pay | Admitting: Family Medicine

## 2019-06-17 NOTE — Telephone Encounter (Signed)
Returned call to patient, she declined offered office visit earlier todayby Kristen Meyer CMA.  Provider called patient back at 5:03pm on 06/17/2019 She reports she now  has rectal pressure/ pain with stools. Nausea, Chills. No fever. Loose stools and her normal is constipation. Denies any bleeding. Burning with urination occasionally. She reports she was hurting really bad last night on her right side, this pain is intermittent. Pain rated at 7/10 lower back/ right lower side. She is concerned maybe her breast implants are making her sick, denies any breast changes. She also questions if the mold on her toothbrush is making her sick.  She is advised to go to the emergency room now for evaluation rule out any emergent diagnosis and then schedule follow up with the office afterward visit. Patient verbalized understanding of all instructions given and denies any further questions at this time. Discussed risks of not going for evaluation.

## 2019-06-17 NOTE — Telephone Encounter (Signed)
Patient should come back to the office for further work up, will need to repeat urine and discuss other concerns at that time. If emergent symptoms before she can be seen in the office she will need to be seen in urgent care/ ED.

## 2019-06-17 NOTE — Telephone Encounter (Signed)
Called and spoke with patient, she reports that she is still having symptoms of urinary frequency. Patient reports that as of recent she has noticed that her urine seems to be darker, patient states that she also has been having increased bowel moments and burning sensation in her rectum when passing movement. Patient reports that she she has to pass bowel movement she has cramping in her abdomen that she reports radiates down to her pelvis. Patient states that she believes she found mold on her tooth brush and is wondering if this could have contributed to her symptoms? Patient request lab drawn to check for exposure to mold

## 2019-06-17 NOTE — Telephone Encounter (Signed)
Patient called and would like a call back from [facility] or from [facility] [employer]. Patient stated that she is still not doing better. She still has tail bone pain, burning in the rectal area and having trouble sleeping at night due to the pain. Please return patients' call back, thanks.

## 2019-06-17 NOTE — Telephone Encounter (Signed)
Patient is declining on coming to office for reevaluation and would like a call back from provider to address concerns of urinalysis, patient states that she has been evaluated at urgent care and needs clarification. KW

## 2019-06-18 ENCOUNTER — Telehealth: Payer: Self-pay

## 2019-06-18 NOTE — Telephone Encounter (Signed)
Spoke with patient and rescheduled her for tomorrow.

## 2019-06-18 NOTE — Telephone Encounter (Signed)
Patient is experiencing excruciating inflamed pelvic pain. Barely is able to walk. Says she was seen by the urgent care & her primary care office. Nothing has helped. Please call patient

## 2019-06-19 ENCOUNTER — Encounter: Payer: Self-pay | Admitting: Obstetrics and Gynecology

## 2019-06-19 ENCOUNTER — Ambulatory Visit: Payer: Managed Care, Other (non HMO) | Admitting: Obstetrics and Gynecology

## 2019-06-19 ENCOUNTER — Other Ambulatory Visit: Payer: Self-pay

## 2019-06-19 VITALS — BP 106/71 | HR 101 | Ht 67.0 in | Wt 167.2 lb

## 2019-06-19 DIAGNOSIS — R102 Pelvic and perineal pain: Secondary | ICD-10-CM | POA: Diagnosis not present

## 2019-06-19 DIAGNOSIS — R399 Unspecified symptoms and signs involving the genitourinary system: Secondary | ICD-10-CM

## 2019-06-19 LAB — POCT URINALYSIS DIPSTICK
Bilirubin, UA: NEGATIVE
Blood, UA: NEGATIVE
Glucose, UA: NEGATIVE
Ketones, UA: NEGATIVE
Leukocytes, UA: NEGATIVE
Nitrite, UA: NEGATIVE
Protein, UA: NEGATIVE
Spec Grav, UA: 1.01 (ref 1.010–1.025)
Urobilinogen, UA: 0.2 E.U./dL
pH, UA: 5 (ref 5.0–8.0)

## 2019-06-19 NOTE — Progress Notes (Signed)
Pt present due to pelvic pain. Pt stated having pelvic pain along with other issues since November 2020. Pt stated having lower back pain, sharp pin/needle pains running down her legs, period of feeling hot, urinated 22xs yesterday, left side pain, feeling of having to pass gas/BM and unable to to. Pt stated that her symptoms did not start all at once but started at first with pelvic pain. UA completed.

## 2019-06-19 NOTE — Telephone Encounter (Signed)
error 

## 2019-06-19 NOTE — Progress Notes (Signed)
HPI:      [name] is a 49 y.o. [phone] who LMP was No LMP recorded. Patient has had an ablation.  Subjective:   She presents today with numerous complaints with her symptom profile becoming worse over time.  Approximately a year and a half ago patient began experiencing lower back pain and pelvic pain with a pins-and-needles feeling in her legs.  This eventually spontaneously resolved.  1 year later she began having the same type of symptoms associated with urinary frequency and bilateral pelvic pain.  During this time she has also experienced occasional diarrhea and some constipation.  Of significant note patient had a tubal ligation and then several years later and endometrial ablation which was complicated by postoperative bleeding and subsequent pelvic surgery. She has undergone a complete blood work work-up as well as STD testing and this is all been "negative". She has previously been tested for irritable bowel syndrome and has undergone upper GI and colonoscopy which were "negative".    Hx: The following portions of the patient's history were reviewed and updated as appropriate:             She  has a past medical history of Abnormal uterine bleeding, Complication of anesthesia, Ovarian cyst, PONV (postoperative nausea and vomiting), and Thyroid nodule. She does not have any pertinent problems on file. She  has a past surgical history that includes Tubal ligation (1994); Tonsillectomy and adenoidectomy (1988); Bunionectomy (Right, 2008); Ablation; Dilation and curettage of uterus; Hysteroscopy; Salpingectomy (Right); Breast enhancement surgery; and laparoscopy (N/A, 08/06/2016). Her family history includes COPD in her mother; Diabetes in her maternal grandmother and mother; Healthy in her brother, father, and sister; Stroke in her maternal grandmother. She  reports that she quit smoking about 32 years ago. She has never used smokeless tobacco. She reports that she does not  drink alcohol or use drugs. She has a current medication list which includes the following prescription(s): albuterol, cyanocobalamin, fluticasone, ibuprofen, lorazepam, magnesium oxide, and ondansetron. She is allergic to amoxicillin; penicillins; phenergan [promethazine hcl]; clarithromycin; latex; sulfa antibiotics; and transderm-scop [scopolamine].       Review of Systems:  Review of Systems  Constitutional: Denied constitutional symptoms, night sweats, recent illness, fatigue, fever, insomnia and weight loss.  Eyes: Denied eye symptoms, eye pain, photophobia, vision change and visual disturbance.  Ears/Nose/Throat/Neck: Denied ear, nose, throat or neck symptoms, hearing loss, nasal discharge, sinus congestion and sore throat.  Cardiovascular: Denied cardiovascular symptoms, arrhythmia, chest pain/pressure, edema, exercise intolerance, orthopnea and palpitations.  Respiratory: Denied pulmonary symptoms, asthma, pleuritic pain, productive sputum, cough, dyspnea and wheezing.  Gastrointestinal: Denied, gastro-esophageal reflux, melena, nausea and vomiting.  Genitourinary:  See HPI for additional information.  Musculoskeletal: Denied musculoskeletal symptoms, stiffness, swelling, muscle weakness and myalgia.  Dermatologic: Denied dermatology symptoms, rash and scar.  Neurologic: Denied neurology symptoms, dizziness, headache, neck pain and syncope.  Psychiatric: Denied psychiatric symptoms, anxiety and depression.  Endocrine: Denied endocrine symptoms including hot flashes and night sweats.   Meds:   Current Outpatient Medications on File Prior to Visit  Medication Sig Dispense Refill  . albuterol (PROVENTIL HFA;VENTOLIN HFA) 108 (90 Base) MCG/ACT inhaler Inhale 2 puffs into the lungs every 6 (six) hours as needed for wheezing or shortness of breath. 1 Inhaler 2  . cyanocobalamin 1000 MCG tablet Take 1,000 mcg by mouth daily.    . fluticasone (FLONASE) 50 MCG/ACT nasal spray Place 2 sprays  into both nostrils daily. 18.2 g 3  . ibuprofen (ADVIL,MOTRIN)  800 MG tablet Take 1 tablet (800 mg total) by mouth 3 (three) times daily. 30 tablet 1  . LORazepam (ATIVAN) 0.5 MG tablet One tablet by mouth 2-3 hours prior to procedure - May repeat at appointment if needed. 5 tablet 0  . magnesium oxide (MAG-OX) 400 MG tablet Take 400 mg by mouth daily.    . ondansetron (ZOFRAN) 4 MG tablet Take one tablet by mouth 1 hour prior to procedure if nausea develops. May repeat in 3-4 hours if needed. (Patient taking differently: as needed. Take one tablet by mouth 1 hour prior to procedure if nausea develops. May repeat in 3-4 hours if needed.) 2 tablet 0   No current facility-administered medications on file prior to visit.    Objective:     Vitals:   06/19/19 0950  BP: 106/71  Pulse: (!) 101                Assessment:    [id number] Patient Active Problem List   Diagnosis Date Noted  . Pelvic pain 07/12/2016  . Hematuria 07/12/2016  . Status post endometrial ablation 07/12/2016  . Status post tubal ligation 07/12/2016  . Allergic rhinitis 11/23/2015  . History of changes in urinary output 11/23/2015  . Mild reactive airways disease 11/23/2015  . CN (constipation) 02/24/2015  . Acid indigestion 02/24/2015  . LBP (low back pain) 02/24/2015  . Galactorrhea not associated with childbirth 02/22/2009  . Enthesopathy 10/28/2008  . Anemia, iron deficiency 03/11/2007     1. Pelvic pain   2. UTI symptoms     Symptoms from multiple different organ systems including neurologic, pelvic, urinary, GI. History of tubal ligation and complications of endometrial ablation this may be part of her pelvic pain picture.   Plan:            1.  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  2.  Recommend pelvic ultrasound to rule out ovarian cysts or obvious hematometra   Orders Orders Placed This Encounter  Procedures  . [place] [facility] Pelvis Complete  . POCT Urinalysis Dipstick     No orders of the defined types were placed in this encounter.     F/U  Return for We will contact her with any abnormal test results. I spent 32 minutes involved in the care of this patient preparing to see the patient by obtaining and reviewing her medical history (including labs, imaging tests and prior procedures), documenting clinical information in the electronic health record (EHR), writing and sending prescriptions, ordering tests or procedures and directly communicating with the patient discussing pertinent items from her history and physical exam as well as my assessment and plan as noted above.  Elonda Husky, M.D. 06/19/2019 10:56 AM

## 2019-06-23 ENCOUNTER — Other Ambulatory Visit: Payer: Self-pay

## 2019-06-23 ENCOUNTER — Ambulatory Visit (INDEPENDENT_AMBULATORY_CARE_PROVIDER_SITE_OTHER): Payer: Managed Care, Other (non HMO)

## 2019-06-23 ENCOUNTER — Telehealth: Payer: Self-pay | Admitting: Obstetrics and Gynecology

## 2019-06-23 DIAGNOSIS — R102 Pelvic and perineal pain: Secondary | ICD-10-CM | POA: Diagnosis not present

## 2019-06-23 NOTE — Telephone Encounter (Signed)
Pt was at my check out window pt wants to be tested for herpes , pt stated that was the only thing she wasn't tested for. Pt is requesting a call from the nurse. Pt needs an order for test sent to [facility]. Please advise

## 2019-06-23 NOTE — Telephone Encounter (Signed)
Please advise on test

## 2019-06-24 ENCOUNTER — Ambulatory Visit: Payer: Managed Care, Other (non HMO) | Admitting: Obstetrics and Gynecology

## 2019-06-25 NOTE — Telephone Encounter (Signed)
Please advise 

## 2019-06-25 NOTE — Telephone Encounter (Signed)
Pt says Dr. Logan Bores wanted her to have a herpes test. Also wants to know the results from her ultrasound. Please call patient

## 2019-06-26 ENCOUNTER — Other Ambulatory Visit: Payer: Self-pay

## 2019-06-26 DIAGNOSIS — Z113 Encounter for screening for infections with a predominantly sexual mode of transmission: Secondary | ICD-10-CM

## 2019-06-26 NOTE — Telephone Encounter (Signed)
Patient returned call and will like to proceed with getting HSV 1 and 2 antibodies lab drawn.  Lab appointment made for 06/29/2019 at 09:15 and orders placed.

## 2019-06-26 NOTE — Telephone Encounter (Signed)
Called patient to make her aware that we can order HSV 1 and 2 antibodies per Dr. Logan Bores response and to also get [facility] fax number to where she wants orders faxed to.  No answer, left voicemail for patient to return call.

## 2019-06-29 ENCOUNTER — Other Ambulatory Visit: Payer: Managed Care, Other (non HMO)

## 2019-06-29 ENCOUNTER — Other Ambulatory Visit: Payer: Self-pay

## 2019-06-30 LAB — HSV(HERPES SIMPLEX VRS) I + II AB-IGG
HSV 1 Glycoprotein G Ab, IgG: 12 index — ABNORMAL HIGH (ref 0.00–0.90)
HSV 2 IgG, Type Spec: 3.41 index — ABNORMAL HIGH (ref 0.00–0.90)

## 2019-06-30 LAB — HSV-2 IGG SUPPLEMENTAL TEST: HSV-2 IgG Supplemental Test: POSITIVE — AB

## 2019-07-07 NOTE — Telephone Encounter (Signed)
Patient calling back about bill for lab work. She is requesting a call back today.

## 2019-07-07 NOTE — Telephone Encounter (Signed)
Kristen Meyer, Patient is an employee of [employer].  She had a pap smear and it was sent to [facility].  She has called about this and I know Kristen Meyer had explained what happened.  I am not sure what the resolution is or could be.  The patient is getting another bill and wants someone to call her back to have this resolved.   Thanks

## 2019-07-09 ENCOUNTER — Other Ambulatory Visit: Payer: Self-pay

## 2019-07-09 ENCOUNTER — Encounter: Payer: Self-pay | Admitting: Obstetrics and Gynecology

## 2019-07-09 ENCOUNTER — Ambulatory Visit (INDEPENDENT_AMBULATORY_CARE_PROVIDER_SITE_OTHER): Payer: Managed Care, Other (non HMO) | Admitting: Obstetrics and Gynecology

## 2019-07-09 VITALS — BP 109/78 | HR 91 | Ht 67.0 in | Wt 156.0 lb

## 2019-07-09 DIAGNOSIS — R768 Other specified abnormal immunological findings in serum: Secondary | ICD-10-CM | POA: Diagnosis not present

## 2019-07-09 DIAGNOSIS — R1013 Epigastric pain: Secondary | ICD-10-CM

## 2019-07-09 NOTE — Progress Notes (Signed)
HPI:      [name] is a 49 y.o. [phone] who LMP was No LMP recorded. Patient has had an ablation.  Subjective:   She presents today for her annual examination.  She continues to have pelvic and abdominal symptoms with intermittent constipation and diarrhea bloating etc. Her most recent pelvic ultrasound was negative. 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  Patient states that she has never had an outbreak but when further questioned she did describe a very sore blister on her buttocks that eventually went away.  This was approximately 1 year ago.    Hx: The following portions of the patient's history were reviewed and updated as appropriate:             She  has a past medical history of Abnormal uterine bleeding, Complication of anesthesia, Ovarian cyst, PONV (postoperative nausea and vomiting), and Thyroid nodule. She does not have any pertinent problems on file. She  has a past surgical history that includes Tubal ligation (1994); Tonsillectomy and adenoidectomy (1988); Bunionectomy (Right, 2008); Ablation; Dilation and curettage of uterus; Hysteroscopy; Salpingectomy (Right); Breast enhancement surgery; and laparoscopy (N/A, 08/06/2016). Her family history includes COPD in her mother; Diabetes in her maternal grandmother and mother; Healthy in her brother, father, and sister; Stroke in her maternal grandmother. She  reports that she quit smoking about 32 years ago. She has never used smokeless tobacco. She reports that she does not drink alcohol or use drugs. She has a current medication list which includes the following prescription(s): albuterol, cyanocobalamin, fluticasone, ibuprofen, lorazepam, magnesium oxide, and ondansetron. She is allergic to amoxicillin; penicillins; phenergan [promethazine hcl]; clarithromycin; latex; sulfa  antibiotics; and transderm-scop [scopolamine].       Review of Systems:  Review of Systems  Constitutional: Denied constitutional symptoms, night sweats, recent illness, fatigue, fever, insomnia and weight loss.  Eyes: Denied eye symptoms, eye pain, photophobia, vision change and visual disturbance.  Ears/Nose/Throat/Neck: Denied ear, nose, throat or neck symptoms, hearing loss, nasal discharge, sinus congestion and sore throat.  Cardiovascular: Denied cardiovascular symptoms, arrhythmia, chest pain/pressure, edema, exercise intolerance, orthopnea and palpitations.  Respiratory: Denied pulmonary symptoms, asthma, pleuritic pain, productive sputum, cough, dyspnea and wheezing.  Gastrointestinal: See HPI for additional information.  Genitourinary: Denied genitourinary symptoms including symptomatic vaginal discharge, pelvic relaxation issues, and urinary complaints.  Musculoskeletal: Denied musculoskeletal symptoms, stiffness, swelling, muscle weakness and myalgia.  Dermatologic: Denied dermatology symptoms, rash and scar.  Neurologic: Denied neurology symptoms, dizziness, headache, neck pain and syncope.  Psychiatric: Denied psychiatric symptoms, anxiety and depression.  Endocrine: Denied endocrine symptoms including hot flashes and night sweats.   Meds:   Current Outpatient Medications on File Prior to Visit  Medication Sig Dispense Refill  . albuterol (PROVENTIL HFA;VENTOLIN HFA) 108 (90 Base) MCG/ACT inhaler Inhale 2 puffs into the lungs every 6 (six) hours as needed for wheezing or shortness of breath. 1 Inhaler 2  . cyanocobalamin 1000 MCG tablet Take 1,000 mcg by mouth daily.    . fluticasone (FLONASE) 50 MCG/ACT nasal spray Place 2 sprays into both nostrils daily. 18.2 g 3  . ibuprofen (ADVIL,MOTRIN) 800 MG tablet Take 1 tablet (800 mg total) by mouth 3 (three) times daily. 30 tablet 1  . LORazepam (ATIVAN) 0.5 MG tablet One tablet by mouth 2-3 hours prior to procedure - May repeat at  appointment if needed. 5  tablet 0  . magnesium oxide (MAG-OX) 400 MG tablet Take 400 mg by mouth daily.    . ondansetron (ZOFRAN) 4 MG tablet Take one tablet by mouth 1 hour prior to procedure if nausea develops. May repeat in 3-4 hours if needed. (Patient taking differently: as needed. Take one tablet by mouth 1 hour prior to procedure if nausea develops. May repeat in 3-4 hours if needed.) 2 tablet 0   No current facility-administered medications on file prior to visit.    Objective:     Vitals:   07/09/19 1047  BP: 109/78  Pulse: 91              Physical examination General NAD, Conversant  HEENT Atraumatic; Op clear with mmm.  Normo-cephalic. Pupils reactive. Anicteric sclerae  Thyroid/Neck Smooth without nodularity or enlargement. Normal ROM.  Neck Supple.  Skin No rashes, lesions or ulceration. Normal palpated skin turgor. No nodularity.  Breasts: No masses or discharge.  Symmetric.  No axillary adenopathy.  Bilateral implants  Lungs: Clear to auscultation.No rales or wheezes. Normal Respiratory effort, no retractions.  Heart: NSR.  No murmurs or rubs appreciated. No periferal edema  Abdomen: Soft.  Non-tender.  No masses.  No HSM. No hernia  Extremities: Moves all appropriately.  Normal ROM for age. No lymphadenopathy.  Neuro: Oriented to PPT.  Normal mood. Normal affect.     Pelvic:   Vulva: Normal appearance.  No lesions.  Vagina: No lesions or abnormalities noted.  Support: Normal pelvic support.  Urethra No masses tenderness or scarring.  Meatus Normal size without lesions or prolapse.  Cervix: Normal appearance.  No lesions.  Anus: Normal exam.  No lesions.  Perineum: Normal exam.  No lesions.        Bimanual   Uterus: Normal size.  Non-tender.  Mobile.  AV.  Adnexae: No masses.  Non-tender to palpation.  Cul-de-sac: Negative for abnormality.      Assessment:    [id number] Patient Active Problem List   Diagnosis Date Noted  . Pelvic pain 07/12/2016  .  Hematuria 07/12/2016  . Status post endometrial ablation 07/12/2016  . Status post tubal ligation 07/12/2016  . Allergic rhinitis 11/23/2015  . History of changes in urinary output 11/23/2015  . Mild reactive airways disease 11/23/2015  . CN (constipation) 02/24/2015  . Acid indigestion 02/24/2015  . LBP (low back pain) 02/24/2015  . Galactorrhea not associated with childbirth 02/22/2009  . Enthesopathy 10/28/2008  . Anemia, iron deficiency 03/11/2007     1. Epigastric pain   2. HSV-2 seropositive     Possibly GI related based on symptomatology.   Plan:            1.  Basic Screening Recommendations The basic screening recommendations for asymptomatic women were discussed with the patient during her visit.  The age-appropriate recommendations were discussed with her and the rational for the tests reviewed.  When I am informed by the patient that another primary care physician has previously obtained the age-appropriate tests and they are up-to-date, only outstanding tests are ordered and referrals given as necessary.  Abnormal results of tests will be discussed with her when all of her results are completed.  Routine preventative health maintenance measures emphasized: Exercise/Diet/Weight control, Tobacco Warnings, Alcohol/Substance use risks and Stress Management Scheduled for mammography-Pap next year 2.  Recommend referral to GI for bloating and abdominal pelvic symptoms with diarrhea and constipation 3.  We have discussed HSV in detail it seems like she has never had  a significant outbreak if any.  Possible outbreak on right buttocks but this is currently unknown.  Orders Orders Placed This Encounter  Procedures  . Ambulatory referral to Gastroenterology    No orders of the defined types were placed in this encounter.       F/U  No follow-ups on file.  Elonda Husky, M.D. 07/09/2019 11:15 AM

## 2019-07-13 ENCOUNTER — Ambulatory Visit (INDEPENDENT_AMBULATORY_CARE_PROVIDER_SITE_OTHER): Payer: Managed Care, Other (non HMO) | Admitting: Gastroenterology

## 2019-07-13 ENCOUNTER — Other Ambulatory Visit: Payer: Self-pay

## 2019-07-13 ENCOUNTER — Encounter: Payer: Self-pay | Admitting: Gastroenterology

## 2019-07-13 VITALS — BP 104/71 | HR 97 | Temp 97.6°F | Ht 67.0 in | Wt 157.0 lb

## 2019-07-13 DIAGNOSIS — R194 Change in bowel habit: Secondary | ICD-10-CM

## 2019-07-13 DIAGNOSIS — R634 Abnormal weight loss: Secondary | ICD-10-CM | POA: Diagnosis not present

## 2019-07-13 DIAGNOSIS — R1084 Generalized abdominal pain: Secondary | ICD-10-CM

## 2019-07-13 MED ORDER — DICYCLOMINE HCL 10 MG PO CAPS: 10.0000 mg | ORAL_CAPSULE | Freq: Three times a day (TID) | ORAL | 2 refills | Status: AC

## 2019-07-13 NOTE — Progress Notes (Signed)
Gastroenterology Consultation  Referring Provider:     Linzie Collin, MD Primary Care Physician:  Tamsen Roers, PA Primary Gastroenterologist:  Dr. Servando Snare     Reason for Consultation:     Abdominal pain and change in bowel habits        HPI:   Kristen Meyer is a 49 y.o. y/o female referred for consultation & management of abdominal pain and change in bowel habits by Dr. Shella Spearing, Jodell Cipro, PA.  This patient comes in today with a history of chronic constipation.  The patient has been seen in the past by Dr. Shelle Iron and underwent a colonoscopy.  The patient states she has had a long history of constipation.  She reports that she has had issues with diarrhea and nausea with a 10 pound weight loss recently.  She states that the 10 pound weight loss has been in the last week.  There is no report of any black stools or bloody stools.  She reports that she had a normal colonoscopy in the past.  There is no family history of colon cancer or colon polyps.  She has had a complicated GYN history with what she describes as an ablation that led to internal bleeding and then scar tissue and a laparoscopy for the thought of possible endometriosis.  She denies that anything was found at that time.  The patient's abdominal pain is reported to be a burning pain in the lower abdomen that goes around to her back.  There is nothing that she reports makes the pain any better or worse.  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  She thinks she had a wound around her buttocks that may have been caused by the HSV.  She is now being sent to me for evaluation of the change in bowel habits and the weight loss with the lower abdominal pain.  Past Medical History:  Diagnosis Date  . Abnormal uterine bleeding   . Complication of anesthesia   . Ovarian cyst    left  . PONV (postoperative nausea and vomiting)   . Thyroid nodule     Past Surgical History:  Procedure  Laterality Date  . ABLATION     uterine  . BREAST ENHANCEMENT SURGERY    . BUNIONECTOMY Right 2008  . DILATION AND CURETTAGE OF UTERUS    . HYSTEROSCOPY    . LAPAROSCOPY N/A 08/06/2016   Procedure: LAPAROSCOPY DIAGNOSTIC;  Surgeon: Herold Harms, MD;  Location: [facility] ORS;  Service: Gynecology;  Laterality: N/A;  . SALPINGECTOMY Right    partial  . TONSILLECTOMY AND ADENOIDECTOMY  1988  . TUBAL LIGATION  1994    Prior to Admission medications   Medication Sig Start Date End Date Taking? Authorizing Provider  albuterol (PROVENTIL HFA;VENTOLIN HFA) 108 (90 Base) MCG/ACT inhaler Inhale 2 puffs into the lungs every 6 (six) hours as needed for wheezing or shortness of breath. 05/15/18   Chrismon, Jodell Cipro, PA  cyanocobalamin 1000 MCG tablet Take 1,000 mcg by mouth daily.    [provider]  fluticasone (FLONASE) 50 MCG/ACT nasal spray Place 2 sprays into both nostrils daily. 05/15/18   Chrismon, Jodell Cipro, PA  ibuprofen (ADVIL,MOTRIN) 800 MG tablet Take 1 tablet (800 mg total) by mouth 3 (three) times daily. 02/18/18   Chrismon, Jodell Cipro, PA  LORazepam (ATIVAN) 0.5 MG tablet One tablet by mouth 2-3 hours prior to procedure - May repeat at appointment  if needed. 02/28/18   Chrismon, Jodell Cipro, PA  magnesium oxide (MAG-OX) 400 MG tablet Take 400 mg by mouth daily.    [provider]  ondansetron (ZOFRAN) 4 MG tablet Take one tablet by mouth 1 hour prior to procedure if nausea develops. May repeat in 3-4 hours if needed. Patient taking differently: as needed. Take one tablet by mouth 1 hour prior to procedure if nausea develops. May repeat in 3-4 hours if needed. 02/28/18   Chrismon, Jodell Cipro, PA    Family History  Problem Relation Age of Onset  . COPD Mother   . Diabetes Mother   . Healthy Father   . Healthy Sister   . Healthy Brother   . Stroke Maternal Grandmother   . Diabetes Maternal Grandmother   . Breast cancer Neg Hx   . Ovarian cancer Neg Hx   . Colon cancer Neg  Hx   . Heart disease Neg Hx      Social History   Tobacco Use  . Smoking status: Former Smoker    Quit date: 08/02/1986    Years since quitting: 32.9  . Smokeless tobacco: Never Used  . Tobacco comment: QUIT IN 1992  Substance Use Topics  . Alcohol use: No    Alcohol/week: 0.0 standard drinks  . Drug use: No    Allergies as of 07/13/2019 - Review Complete 07/09/2019  Allergen Reaction Noted  . Amoxicillin Hives 02/24/2015  . Penicillins  02/24/2015  . Phenergan [promethazine hcl] Nausea And Vomiting 08/06/2016  . Clarithromycin Nausea And Vomiting and Rash 02/24/2015  . Latex Itching and Rash 02/24/2015  . Sulfa antibiotics Itching, Nausea And Vomiting, and Rash 02/24/2015  . Transderm-scop [scopolamine] Rash 08/02/2016    Review of Systems:    All systems reviewed and negative except where noted in HPI.   Physical Exam:  There were no vitals taken for this visit. No LMP recorded. Patient has had an ablation. General:   Alert,  Well-developed, well-nourished, pleasant and cooperative in NAD Head:  Normocephalic and atraumatic. Eyes:  Sclera clear, no icterus.   Conjunctiva pink. Ears:  Normal auditory acuity. Neck:  Supple; no masses or thyromegaly. Lungs:  Respirations even and unlabored.  Clear throughout to auscultation.   No wheezes, crackles, or rhonchi. No acute distress. Heart:  Regular rate and rhythm; no murmurs, clicks, rubs, or gallops. Abdomen:  Normal bowel sounds.  No bruits.  Soft, positive tenderness in the right lower quadrant and non-distended without masses, hepatosplenomegaly or hernias noted.  No guarding or rebound tenderness.  Negative Carnett sign.   Rectal:  Deferred.  Pulses:  Normal pulses noted. Extremities:  No clubbing or edema.  No cyanosis. Neurologic:  Alert and oriented x3;  grossly normal neurologically. Skin:  Intact without significant lesions or rashes.  No jaundice. Lymph Nodes:  No significant cervical adenopathy. Psych:  Alert  and cooperative. Normal mood and affect.  Imaging Studies: [place] GYN Pelvis Complete  Result Date: 06/26/2019 Patient Name: Kristen Meyer DOB: 03/12/1971 MRN: [id number] ULTRASOUND REPORT Location: [facility] Date of Service: 06/23/2019 Indications:Pelvic Pain Findings: The uterus is anteverted and measures 8.6 x 3.7 x 5.1 cm.. Echo texture is homogenous without evidence of focal masses. The Endometrium measures 3 mm. Right Ovary measures 2.1 x 2.3 x 2.6 cm. It is normal in appearance. Hypoechoic lesion, smooth walled with good through transmission  see measuring 1.4 x 1.4 x 1.9 cm. Left Ovary measures 2.6 x 1.9 x 2.4 cm. It is normal in appearance. Survey of the adnexa demonstrates no adnexal masses. There is no free fluid in the cul de sac. Impression: Rt ovary shows a dominant follicle. Recommendations: 1.Clinical correlation with the patient's History and Physical Exam. Jenine M. Albertine Grates    RDMS The ultrasound images and findings were reviewed by me and I agree with the above report. Finis Bud, M.D. 06/26/2019 11:19 AM   Assessment and Plan:   JONISHA KINDIG is a 49 y.o. y/o female who comes in today with a history of multiple GYN problems in the past and now reporting that she has lower abdominal pain and a change in bowel habits with diarrhea.  She also states that she has lost 10 pounds in the last week without trying.  The patient has had a negative colonoscopy with a normal terminal ileum in the past.  The patient will be set up for a CT scan of the abdomen and pelvis due to her weight loss and change in bowel habits with her abdominal pain.  She will also have her blood sent off for C-reactive protein to look for any sign of inflammation or inflammatory bowel disease.  In addition to this the patient has crampy abdominal pain that will be treated with  dicyclomine.  The patient has been explained the plan and agrees with it.    Lucilla Lame, MD. Marval Regal    Note: This dictation was prepared with Dragon dictation along with smaller phrase technology. Any transcriptional errors that result from this process are unintentional.

## 2019-07-15 LAB — CELIAC DISEASE PANEL
Endomysial IgA: NEGATIVE
IgA/Immunoglobulin A, Serum: 320 mg/dL (ref 87–352)
Transglutaminase IgA: <2 U/mL (ref 0–3)

## 2019-07-15 LAB — C-REACTIVE PROTEIN: CRP: 2 mg/L (ref 0–10)

## 2019-07-21 ENCOUNTER — Other Ambulatory Visit: Payer: Self-pay

## 2019-07-21 ENCOUNTER — Ambulatory Visit
Admission: RE | Admit: 2019-07-21 | Discharge: 2019-07-21 | Disposition: A | Discharge status: Home / Self Care | Payer: Managed Care, Other (non HMO) | Source: Ambulatory Visit | Attending: Gastroenterology | Admitting: Gastroenterology

## 2019-07-21 DIAGNOSIS — R1084 Generalized abdominal pain: Secondary | ICD-10-CM | POA: Insufficient documentation

## 2019-07-21 HISTORY — DX: Unspecified asthma, uncomplicated: J45.909

## 2019-07-21 MED ORDER — IOHEXOL 300 MG/ML  SOLN
100.0000 mL | Freq: Once | INTRAMUSCULAR | Status: AC | PRN
  Administered 2019-07-21: 100 mL via INTRAVENOUS

## 2019-07-22 ENCOUNTER — Telehealth: Payer: Self-pay

## 2019-07-22 NOTE — Telephone Encounter (Signed)
Notified pt of CT results via mychart.

## 2019-07-22 NOTE — Telephone Encounter (Signed)
-----   Message from Midge Minium, MD sent at 07/21/2019  7:15 PM EST ----- Let the patient know that the CT scan did not show any significant abnormalities.

## 2019-07-30 ENCOUNTER — Ambulatory Visit: Payer: Managed Care, Other (non HMO) | Admitting: Gastroenterology

## 2019-08-19 IMAGING — MR MR LUMBAR SPINE W/O CM
5 series · 31 of 48 positions shown · non-contrast
Comparison: 05/04/2015 CT abdomen and pelvis.

CLINICAL DATA: 46 y/o F; lower back pain after yard work earlier
this month. Bilateral foot pain.

EXAM:
MRI LUMBAR SPINE WITHOUT CONTRAST
TECHNIQUE: Multiplanar, multisequence MR imaging of the lumbar spine was
performed. No intravenous contrast was administered.

[Series 5: T2 · sagittal · 4.0mm · 0.81mm/px · 6 of 16 slices shown (1 of 2)]
[im 1/16]
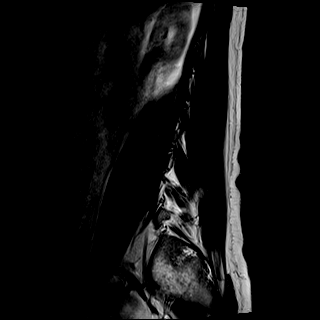
[im 4/16]
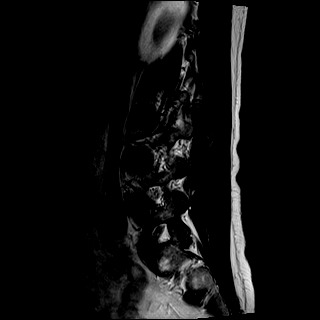
[im 7/16]
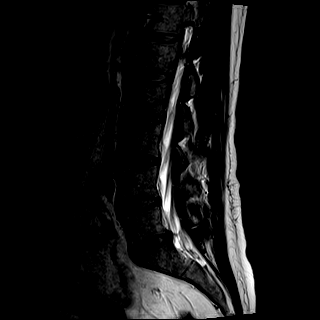
[im 10/16]
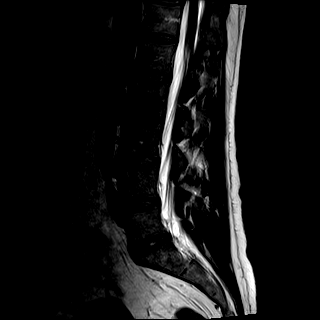
[im 13/16]
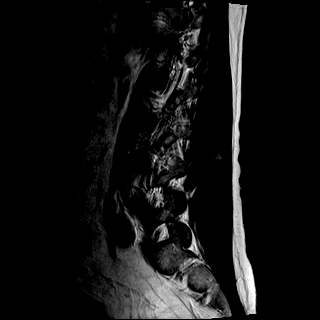
[im 16/16]
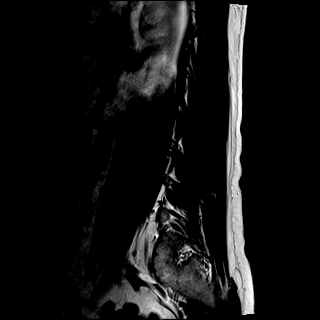

[Series 7: STIR · sagittal · 4.0mm · 0.41mm/px · 1 of 16 slices shown]
[im 1/16]
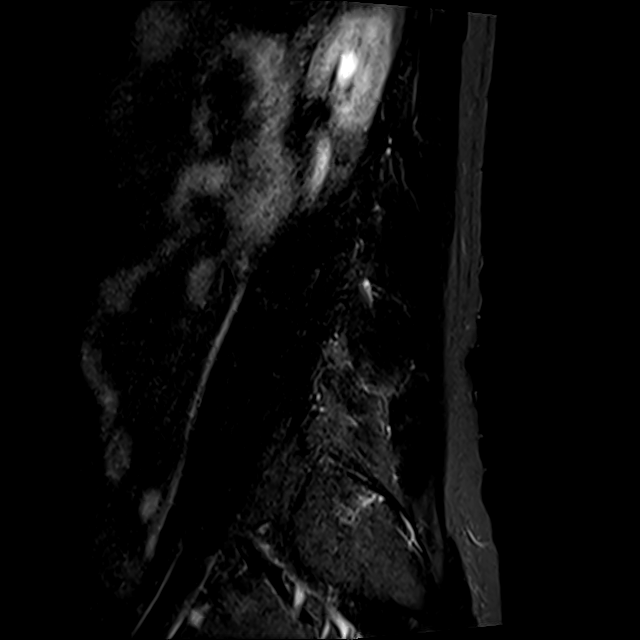

[Series 8: T1 · sagittal · 4.0mm · 0.81mm/px · 6 of 15 slices shown (1 of 2)]
[im 1/15]
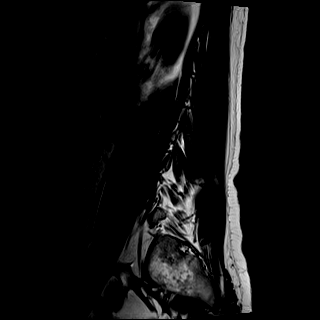
[im 3/15]
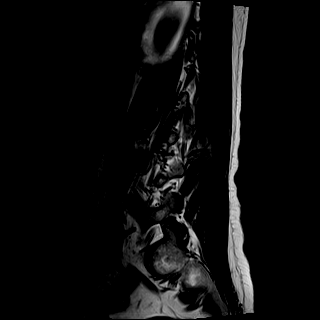
[im 6/15]
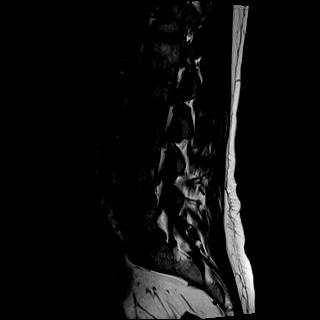
[im 9/15]
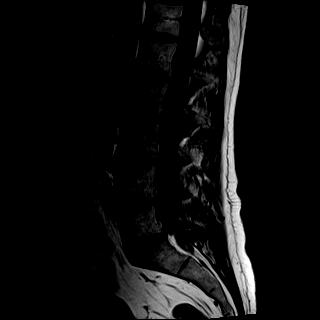
[im 12/15]
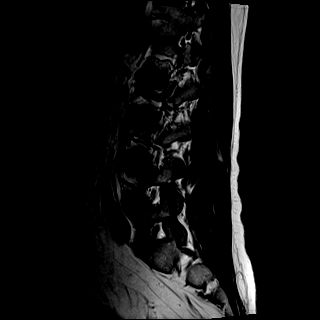
[im 15/15]
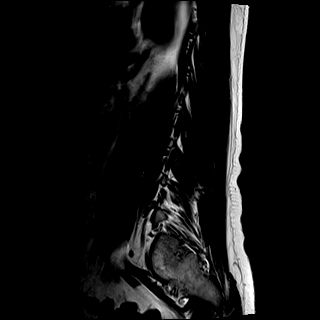

[Series 10: T1 · axial · 4.0mm · 0.39mm/px · z∈[-109,+103]mm · 9 of 36 slices shown (2 of 2)]
[im 1/36]
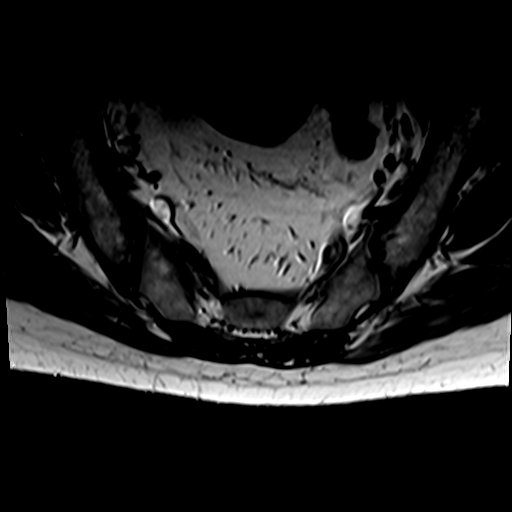
[im 6/36]
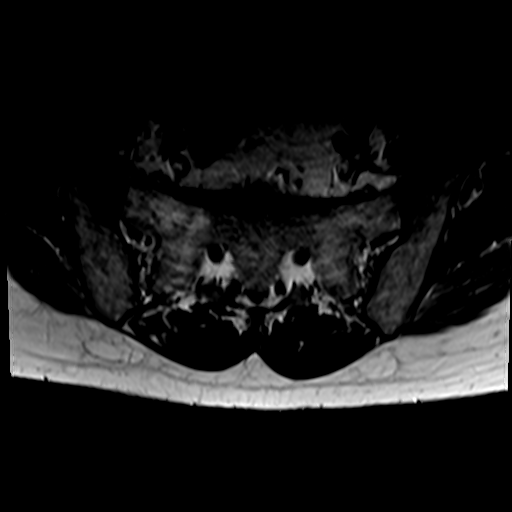
[im 11/36]
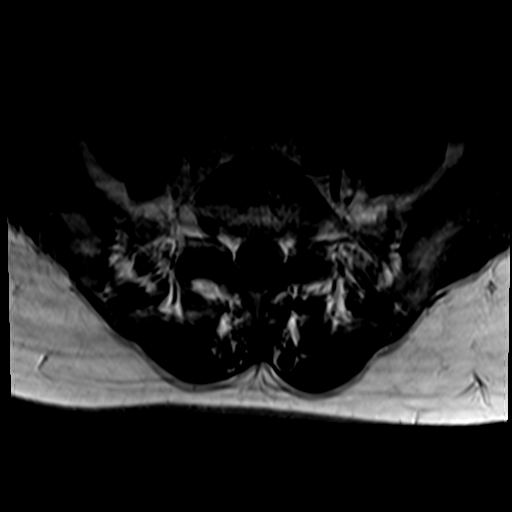
[im 16/36]
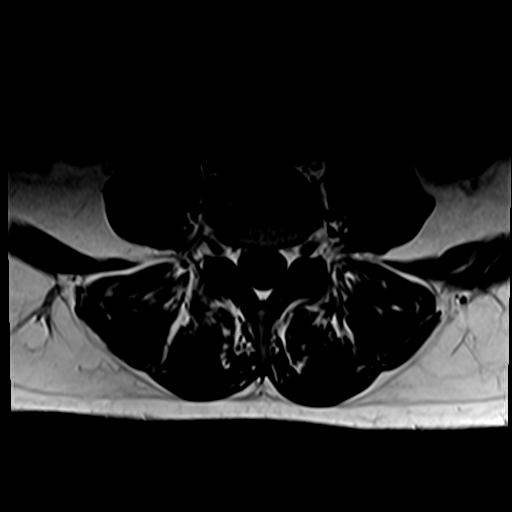
[im 18/36]
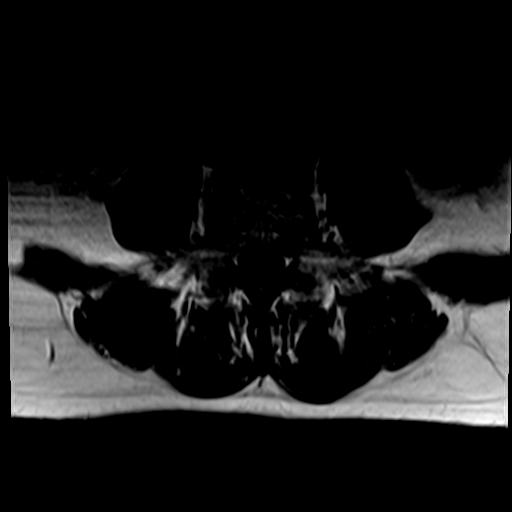
[im 21/36]
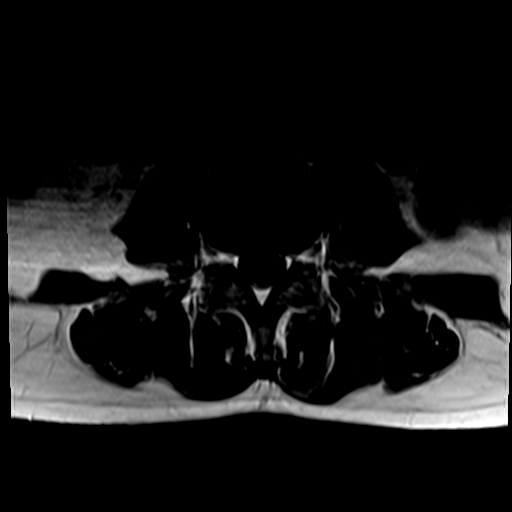
[im 26/36]
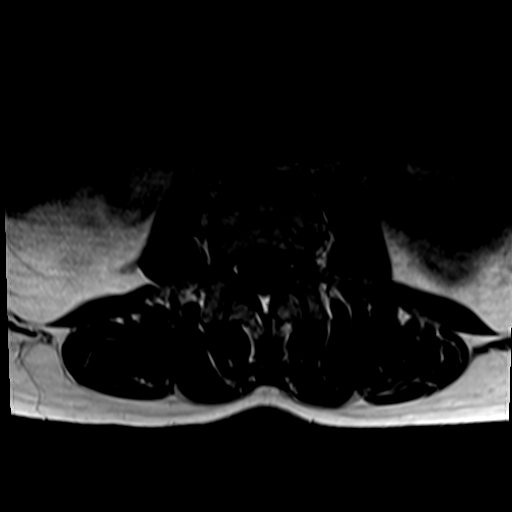
[im 31/36]
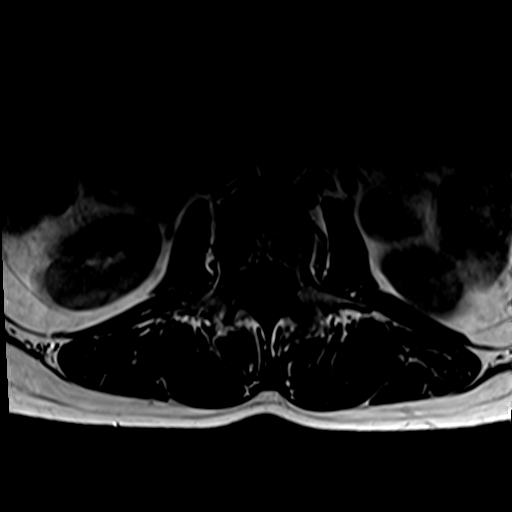
[im 36/36]
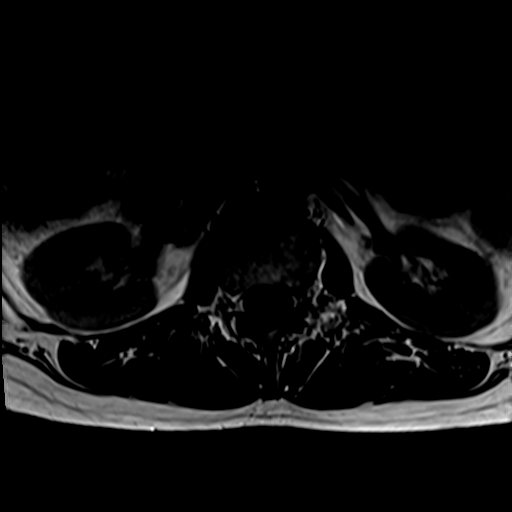

[Series 11: T2 · axial · 4.0mm · 0.78mm/px · z∈[-109,+103]mm · 9 of 36 slices shown (2 of 2)]
[im 1/36]
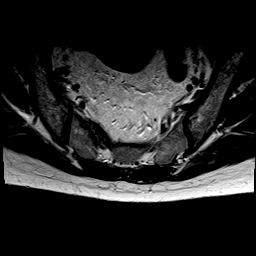
[im 6/36]
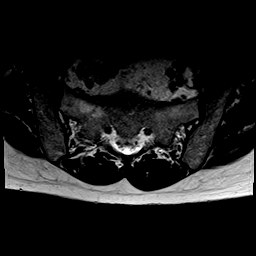
[im 11/36]
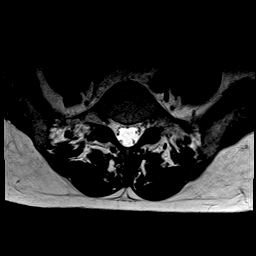
[im 16/36]
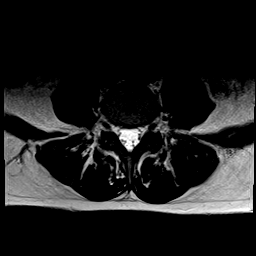
[im 18/36]
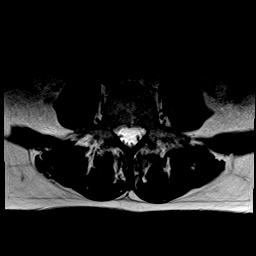
[im 21/36]
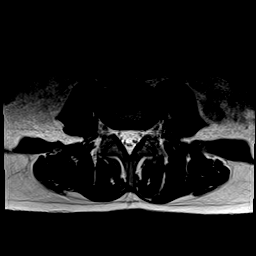
[im 26/36]
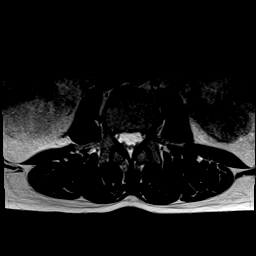
[im 31/36]
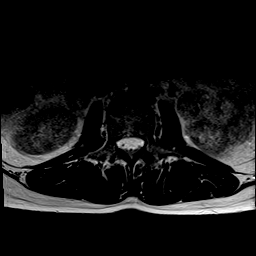
[im 36/36]
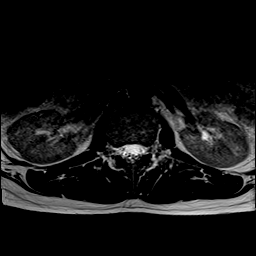

[31 of 48 positions shown; findings below may reference images not displayed]

FINDINGS: Segmentation:  Standard.

Alignment:  Physiologic.

Vertebrae:  No fracture, evidence of discitis, or bone lesion.

Conus medullaris and cauda equina: Conus extends to the L1 level.
Conus and cauda equina appear normal.

Paraspinal and other soft tissues: Negative.

Disc levels:

L1-2: No significant disc displacement, foraminal stenosis, or canal
stenosis.

L2-3: No significant disc displacement, foraminal stenosis, or canal
stenosis.

L3-4: No significant disc displacement, foraminal stenosis, or canal
stenosis.

L4-5: No significant disc displacement, foraminal stenosis, or canal
stenosis.

L5-S1: Disc desiccation with mild loss of intervertebral disc space
height. Small central disc protrusion with ventral thecal sac
effacement. No significant foraminal or canal stenosis.
IMPRESSION: 1. L5-S1 mild discogenic degenerative changes. No significant
foraminal or canal stenosis.
2. No acute osseous abnormality or malalignment.

By: Levovo Oromeng M.D.

## 2020-03-21 ENCOUNTER — Telehealth: Payer: Self-pay

## 2020-03-21 NOTE — Telephone Encounter (Signed)
Kristen Meyer I think she is wanting a referral to neurology for back pain.

## 2020-03-21 NOTE — Telephone Encounter (Signed)
Without more recent documentation and physical exam of back pain, insurance may not cover referral. Should schedule follow up here or with the orthopedic spine specialist.

## 2020-03-21 NOTE — Telephone Encounter (Signed)
Called the patient and she stated she needed a referral.  Looked to see if patient had a call from Maye Hides and didn't see a referral.  Told patient she might need to come in for an office visit since the last time the patient was seen for this problem was 02/25/2018.  Patient refused and said she felt it was silly.  Stated she spoke to the referring office and they said they would take a referral and a copy of the MRI that was ordered by Maurine Minister.  When seen in 2019 she was referred to Orthopedic/Spine specialist, did physical therapy and feels she needs to see a neurologist instead since pain has come back.  Stated she would like to go to [facility].       Copied from CRM [id number]. Topic: General - Other >> Mar 07, 2020  1:24 PM Kristen Meyer wrote: Reason for CRM: Pt requested to speak with Maralyn Sago. Pt requests that Maralyn Sago return her call. Cb# [phone]

## 2020-03-22 NOTE — Telephone Encounter (Signed)
Advised 

## 2021-03-21 ENCOUNTER — Telehealth: Payer: Self-pay

## 2021-03-21 NOTE — Telephone Encounter (Signed)
Copied from CRM [id number]. Topic: Appointment Scheduling - Scheduling Inquiry for Clinic >> Mar 21, 2021  3:46 PM Randol Kern wrote: Reason for CRM: Pt wants to be rescheduled, upset that she was not notified of her CPE cancellation. Robynn Pane payne request  Best contact: [phone]  Prefers early morning

## 2021-04-11 ENCOUNTER — Encounter: Payer: Managed Care, Other (non HMO) | Admitting: Family Medicine

## 2021-06-01 ENCOUNTER — Encounter: Payer: Managed Care, Other (non HMO) | Admitting: Family Medicine

## 2021-09-20 ENCOUNTER — Encounter: Payer: Managed Care, Other (non HMO) | Admitting: Family Medicine

## 2022-01-10 ENCOUNTER — Encounter: Payer: Managed Care, Other (non HMO) | Admitting: Physician Assistant

## 2022-01-30 NOTE — Progress Notes (Signed)
Complete physical exam  I,April Miller,acting as a scribe for Wilhemena Durie, MD.,have documented all relevant documentation on the behalf of Wilhemena Durie, MD,as directed by  Wilhemena Durie, MD while in the presence of Wilhemena Durie, MD.   Patient: Kristen Meyer   DOB: [birth year]   51 y.o. Female  MRN: [id number] Visit Date: 01/31/2022  Today's healthcare provider: Wilhemena Durie, MD   Chief Complaint  Patient presents with   Annual Exam   Subjective    Kristen Meyer is a 51 y.o. female who presents today for a complete physical exam.  She reports consuming a general diet. The patient does not participate in regular exercise at present. She generally feels fairly well. She reports sleeping poorly. She does not have additional problems to discuss today.    HPI    Past Medical History:  Diagnosis Date   Abnormal uterine bleeding    Asthma    Complication of anesthesia    Ovarian cyst    left   PONV (postoperative nausea and vomiting)    Thyroid nodule    Past Surgical History:  Procedure Laterality Date   ABLATION     uterine   BREAST ENHANCEMENT SURGERY     BUNIONECTOMY Right 2008   DILATION AND CURETTAGE OF UTERUS     HYSTEROSCOPY     LAPAROSCOPY N/A 08/06/2016   Procedure: LAPAROSCOPY DIAGNOSTIC;  Surgeon: Brayton Mars, MD;  Location: [facility] ORS;  Service: Gynecology;  Laterality: N/A;   SALPINGECTOMY Right    partial   TONSILLECTOMY AND ADENOIDECTOMY  1988   TUBAL LIGATION  1994   Social History   Socioeconomic History   Marital status: Divorced    Spouse name: Not on file   Number of children: Not on file   Years of education: Not on file   Highest education level: Not on file  Occupational History   Not on file  Tobacco Use   Smoking status: Former    Types: Cigarettes    Quit date: 08/02/1986    Years since quitting: 35.5   Smokeless tobacco: Never   Tobacco comments:    QUIT IN 1992  Vaping Use    Vaping Use: Not on file  Substance and Sexual Activity   Alcohol use: No    Alcohol/week: 0.0 standard drinks of alcohol   Drug use: No   Sexual activity: Yes    Birth control/protection: Surgical  Other Topics Concern   Not on file  Social History Narrative   Not on file   Social Determinants of Health   Financial Resource Strain: Not on file  Food Insecurity: Not on file  Transportation Needs: Not on file  Physical Activity: Not on file  Stress: Not on file  Social Connections: Not on file  Intimate Partner Violence: Not on file   Family Status  Relation Name Status   Mother  Alive   Father  Alive   Sister  Alive   Brother  Alive   MGM  Alive   Neg Hx  (Not Specified)   Family History  Problem Relation Age of Onset   COPD Mother    Diabetes Mother    Healthy Father    Healthy Sister    Healthy Brother    Stroke Maternal Grandmother    Diabetes Maternal Grandmother    Breast cancer Neg Hx    Ovarian cancer Neg Hx    Colon cancer Neg Hx  Heart disease Neg Hx    Allergies  Allergen Reactions   Amoxicillin Hives    Has patient had a PCN reaction causing immediate rash, facial/tongue/throat swelling, SOB or lightheadedness with hypotension: No Has patient had a PCN reaction causing severe rash involving mucus membranes or skin necrosis: Yes Has patient had a PCN reaction that required hospitalization No Has patient had a PCN reaction occurring within the last 10 years: No If all of the above answers are "NO", then may proceed with Cephalosporin use.    Penicillins    Phenergan [Promethazine Hcl] Nausea And Vomiting   Clarithromycin Nausea And Vomiting and Rash   Latex Itching and Rash    burns   Sulfa Antibiotics Itching, Nausea And Vomiting and Rash    Other reaction(s): Unknown   Transderm-Scop [Scopolamine] Rash    n/v    Patient Care Team: Chrismon, Vickki Muff, PA-C (Inactive) as PCP - General (Physician Assistant)   Medications: Outpatient  Medications Prior to Visit  Medication Sig   albuterol (PROVENTIL HFA;VENTOLIN HFA) 108 (90 Base) MCG/ACT inhaler Inhale 2 puffs into the lungs every 6 (six) hours as needed for wheezing or shortness of breath.   cyanocobalamin 1000 MCG tablet Take 1,000 mcg by mouth daily.   dicyclomine (BENTYL) 10 MG capsule Take 1 capsule (10 mg total) by mouth 4 (four) times daily -  before meals and at bedtime.   fluticasone (FLONASE) 50 MCG/ACT nasal spray Place 2 sprays into both nostrils daily.   ibuprofen (ADVIL,MOTRIN) 800 MG tablet Take 1 tablet (800 mg total) by mouth 3 (three) times daily.   LORazepam (ATIVAN) 0.5 MG tablet One tablet by mouth 2-3 hours prior to procedure - May repeat at appointment if needed.   magnesium oxide (MAG-OX) 400 MG tablet Take 400 mg by mouth daily.   Naproxen Sodium (ALEVE) 220 MG CAPS Take by mouth as needed.   ondansetron (ZOFRAN) 4 MG tablet Take one tablet by mouth 1 hour prior to procedure if nausea develops. May repeat in 3-4 hours if needed. (Patient taking differently: as needed. Take one tablet by mouth 1 hour prior to procedure if nausea develops. May repeat in 3-4 hours if needed.)   Pyridoxine HCl (VITAMIN B-6 PO) Take by mouth.   No facility-administered medications prior to visit.    Review of Systems  Eyes:  Positive for photophobia and itching.  Gastrointestinal:  Positive for abdominal distention, abdominal pain, constipation, diarrhea and nausea.  Musculoskeletal:  Positive for back pain, myalgias, neck pain and neck stiffness.  Neurological:  Positive for dizziness, weakness and light-headedness.  Psychiatric/Behavioral:  Positive for sleep disturbance. The patient is nervous/anxious.   All other systems reviewed and are negative.     Objective     BP 108/69 (BP Location: Right Arm, Patient Position: Sitting, Cuff Size: Normal)   Pulse 72   Resp 14   Ht 5' 6.5" (1.689 m)   Wt 164 lb (74.4 kg)   SpO2 96%   BMI 26.07 kg/m     Physical  Exam Vitals reviewed.  Constitutional:      Appearance: She is well-developed.  HENT:     Head: Normocephalic and atraumatic.     Right Ear: External ear normal.     Left Ear: External ear normal.     Nose: Nose normal.  Eyes:     General:        Right eye: No discharge.     Conjunctiva/sclera: Conjunctivae normal.     Pupils:  Pupils are equal, round, and reactive to light.  Neck:     Thyroid: No thyromegaly.     Trachea: No tracheal deviation.     Comments: Stable right thyroid nodule/enlargement Cardiovascular:     Rate and Rhythm: Normal rate and regular rhythm.     Heart sounds: Normal heart sounds. No murmur heard. Pulmonary:     Effort: Pulmonary effort is normal. No respiratory distress.     Breath sounds: Normal breath sounds. No wheezing or rales.  Chest:     Chest wall: No tenderness.  Abdominal:     General: Bowel sounds are normal. There is no distension.     Palpations: Abdomen is soft. There is no mass.     Tenderness: There is no abdominal tenderness. There is no guarding or rebound.  Musculoskeletal:        General: No tenderness.     Cervical back: Neck supple.  Lymphadenopathy:     Cervical: No cervical adenopathy.  Skin:    General: Skin is warm and dry.     Findings: No erythema or rash.  Neurological:     General: No focal deficit present.     Mental Status: She is alert and oriented to person, place, and time.     Cranial Nerves: No cranial nerve deficit.     Motor: No abnormal muscle tone.     Coordination: Coordination normal.     Deep Tendon Reflexes: Reflexes are normal and symmetric. Reflexes normal.  Psychiatric:        Mood and Affect: Mood normal.        Behavior: Behavior normal.        Thought Content: Thought content normal.        Judgment: Judgment normal.       Last depression screening scores    01/31/2022    1:26 PM 05/15/2018    1:30 PM 02/08/2017    9:27 AM  PHQ 2/9 Scores  PHQ - 2 Score 1 0 0  PHQ- 9 Score 6      Last fall risk screening    01/31/2022    1:26 PM  Fall Risk   Falls in the past year? 0  Number falls in past yr: 0  Injury with Fall? 0  Risk for fall due to : No Fall Risks  Follow up Falls evaluation completed   Last Audit-C alcohol use screening    01/31/2022    1:25 PM  Alcohol Use Disorder Test (AUDIT)  1. How often do you have a drink containing alcohol? 2  2. How many drinks containing alcohol do you have on a typical day when you are drinking? 2  3. How often do you have six or more drinks on one occasion? 0  AUDIT-C Score 4  4. How often during the last year have you found that you were not able to stop drinking once you had started? 0  5. How often during the last year have you failed to do what was normally expected from you because of drinking? 0  6. How often during the last year have you needed a first drink in the morning to get yourself going after a heavy drinking session? 0  7. How often during the last year have you had a feeling of guilt of remorse after drinking? 0  8. How often during the last year have you been unable to remember what happened the night before because you had been drinking? 0  9. Have  you or someone else been injured as a result of your drinking? 0  10. Has a relative or friend or a doctor or another health worker been concerned about your drinking or suggested you cut down? 0  Alcohol Use Disorder Identification Test Final Score (AUDIT) 4   A score of 3 or more in women, and 4 or more in men indicates increased risk for alcohol abuse, EXCEPT if all of the points are from question 1   No results found for any visits on 01/31/22.  Assessment & Plan    Routine Health Maintenance and Physical Exam  Exercise Activities and Dietary recommendations  Goals   None     Immunization History  Administered Date(s) Administered   Hepatitis A 03/13/2010, 04/10/2010   Hepatitis B 03/13/2010, 04/10/2010   Influenza,inj,Quad PF,6+ Mos  02/18/2018   Influenza-Unspecified 04/02/2017, 03/15/2019   Td 09/02/1989   Tdap 03/13/2010, 02/08/2017    Health Maintenance  Topic Date Due   COVID-19 Vaccine (1) Never done   PAP SMEAR-Modifier  02/09/2020   MAMMOGRAM  Never done   Zoster Vaccines- Shingrix (1 of 2) Never done   INFLUENZA VACCINE  01/09/2022   COLONOSCOPY (Pts 45-8yrs Insurance coverage will need to be confirmed)  06/21/2025   TETANUS/TDAP  02/09/2027   Hepatitis C Screening  Completed   HIV Screening  Completed   HPV VACCINES  Aged Out    Discussed health benefits of physical activity, and encouraged her to engage in regular exercise appropriate for her age and condition.  1. Annual physical exam  - Lipid panel - TSH - CBC w/Diff/Platelet - Comprehensive Metabolic Panel (CMET)  2. Iron deficiency anemia, unspecified iron deficiency anemia type  - Lipid panel - TSH - CBC w/Diff/Platelet - Comprehensive Metabolic Panel (CMET)  3. Allergic rhinitis due to pollen, unspecified seasonality  - Lipid panel - TSH - CBC w/Diff/Platelet - Comprehensive Metabolic Panel (CMET)  4. Acid indigestion  - Lipid panel - TSH - CBC w/Diff/Platelet - Comprehensive Metabolic Panel (CMET)  5. Screening mammogram for breast cancer  - MM 3D SCREEN BREAST BILATERAL  6. Chronic idiopathic constipation Try Linzess  145 daily Samples given. 7. Thyroid nodule Negative work-up with FNA.  No further work-up necessary   No follow-ups on file.     I, Megan Mans, MD, have reviewed all documentation for this visit. The documentation on 02/02/22 for the exam, diagnosis, procedures, and orders are all accurate and complete.    Golda Zavalza Wendelyn Breslow, MD  [facility] [phone] (phone) [phone] (fax)  [facility]

## 2022-01-31 ENCOUNTER — Ambulatory Visit (INDEPENDENT_AMBULATORY_CARE_PROVIDER_SITE_OTHER): Payer: Managed Care, Other (non HMO) | Admitting: Family Medicine

## 2022-01-31 ENCOUNTER — Encounter: Payer: Self-pay | Admitting: Family Medicine

## 2022-01-31 VITALS — BP 108/69 | HR 72 | Resp 14 | Ht 66.5 in | Wt 164.0 lb

## 2022-01-31 DIAGNOSIS — E041 Nontoxic single thyroid nodule: Secondary | ICD-10-CM

## 2022-01-31 DIAGNOSIS — J301 Allergic rhinitis due to pollen: Secondary | ICD-10-CM | POA: Diagnosis not present

## 2022-01-31 DIAGNOSIS — K30 Functional dyspepsia: Secondary | ICD-10-CM | POA: Diagnosis not present

## 2022-01-31 DIAGNOSIS — Z1231 Encounter for screening mammogram for malignant neoplasm of breast: Secondary | ICD-10-CM

## 2022-01-31 DIAGNOSIS — Z0000 Encounter for general adult medical examination without abnormal findings: Secondary | ICD-10-CM

## 2022-01-31 DIAGNOSIS — D509 Iron deficiency anemia, unspecified: Secondary | ICD-10-CM

## 2022-01-31 DIAGNOSIS — K5904 Chronic idiopathic constipation: Secondary | ICD-10-CM

## 2022-02-01 LAB — CBC WITH DIFFERENTIAL/PLATELET
Basophils Absolute: 0.1 10*3/uL (ref 0.0–0.2)
Basos: 1 %
EOS (ABSOLUTE): 0.3 10*3/uL (ref 0.0–0.4)
Eos: 4 %
Hematocrit: 38.8 % (ref 34.0–46.6)
Hemoglobin: 13 g/dL (ref 11.1–15.9)
Immature Grans (Abs): 0 10*3/uL (ref 0.0–0.1)
Immature Granulocytes: 0 %
Lymphocytes Absolute: 2 10*3/uL (ref 0.7–3.1)
Lymphs: 30 %
MCH: 31.3 pg (ref 26.6–33.0)
MCHC: 33.5 g/dL (ref 31.5–35.7)
MCV: 93 fL (ref 79–97)
Monocytes Absolute: 0.6 10*3/uL (ref 0.1–0.9)
Monocytes: 9 %
Neutrophils Absolute: 3.8 10*3/uL (ref 1.4–7.0)
Neutrophils: 56 %
Platelets: 352 10*3/uL (ref 150–450)
RBC: 4.16 x10E6/uL (ref 3.77–5.28)
RDW: 11.8 % (ref 11.7–15.4)
WBC: 6.7 10*3/uL (ref 3.4–10.8)

## 2022-02-01 LAB — COMPREHENSIVE METABOLIC PANEL
ALT: 18 IU/L (ref 0–32)
AST: 20 IU/L (ref 0–40)
Albumin/Globulin Ratio: 2 (ref 1.2–2.2)
Albumin: 4.5 g/dL (ref 3.9–4.9)
Alkaline Phosphatase: 65 IU/L (ref 44–121)
BUN/Creatinine Ratio: 10 (ref 9–23)
BUN: 9 mg/dL (ref 6–24)
Bilirubin Total: 0.3 mg/dL (ref 0.0–1.2)
CO2: 25 mmol/L (ref 20–29)
Calcium: 9.6 mg/dL (ref 8.7–10.2)
Chloride: 103 mmol/L (ref 96–106)
Creatinine, Ser: 0.88 mg/dL (ref 0.57–1.00)
Globulin, Total: 2.3 g/dL (ref 1.5–4.5)
Glucose: 93 mg/dL (ref 70–99)
Potassium: 4.8 mmol/L (ref 3.5–5.2)
Sodium: 141 mmol/L (ref 134–144)
Total Protein: 6.8 g/dL (ref 6.0–8.5)
eGFR: 80 mL/min/{1.73_m2} (ref >59)

## 2022-02-01 LAB — LIPID PANEL
Chol/HDL Ratio: 5 ratio — ABNORMAL HIGH (ref 0.0–4.4)
Cholesterol, Total: 241 mg/dL — ABNORMAL HIGH (ref 100–199)
HDL: 48 mg/dL (ref >39)
LDL Chol Calc (NIH): 147 mg/dL — ABNORMAL HIGH (ref 0–99)
Triglycerides: 255 mg/dL — ABNORMAL HIGH (ref 0–149)
VLDL Cholesterol Cal: 46 mg/dL — ABNORMAL HIGH (ref 5–40)

## 2022-02-01 LAB — TSH: TSH: 1.71 u[IU]/mL (ref 0.450–4.500)

## 2022-02-06 ENCOUNTER — Encounter: Payer: Self-pay | Admitting: Family Medicine

## 2022-04-12 NOTE — Telephone Encounter (Signed)
Agree with Dr Quentin Cornwall.  We care about cholesterol because it is a surrogate marker for risk for heart disease and stroke.  We use a risk calculator that calculates your 10-year risk of heart disease and stroke.  This takes into account your age, gender, blood pressure, whether or not you have diabetes, whether or not you smoke, as well as your cholesterol.  At this time, patient's 10-year risk is 1.4%.  This is low and we do not consider this to need a statin medication.  Is recommended to eat healthy and exercise regularly for heart health and to continue to monitor this annually.
